# Patient Record
Sex: Male | Born: 1975 | Race: White | Hispanic: No | Marital: Married | Smoking: Never smoker
Health system: Southern US, Community
[De-identification: ages and names within clinical notes are randomized; demographics above are authoritative.]

## PROBLEM LIST (undated history)

## (undated) DIAGNOSIS — R579 Shock, unspecified: Secondary | ICD-10-CM

## (undated) DIAGNOSIS — E785 Hyperlipidemia, unspecified: Secondary | ICD-10-CM

## (undated) DIAGNOSIS — M109 Gout, unspecified: Secondary | ICD-10-CM

## (undated) DIAGNOSIS — I10 Essential (primary) hypertension: Secondary | ICD-10-CM

## (undated) DIAGNOSIS — K5792 Diverticulitis of intestine, part unspecified, without perforation or abscess without bleeding: Secondary | ICD-10-CM

## (undated) DIAGNOSIS — F32A Depression, unspecified: Secondary | ICD-10-CM

## (undated) DIAGNOSIS — F329 Major depressive disorder, single episode, unspecified: Secondary | ICD-10-CM

## (undated) DIAGNOSIS — F419 Anxiety disorder, unspecified: Secondary | ICD-10-CM

## (undated) HISTORY — DX: Essential (primary) hypertension: I10

---

## 2015-03-15 ENCOUNTER — Encounter (HOSPITAL_COMMUNITY): Payer: Self-pay | Admitting: Family Medicine

## 2015-03-15 ENCOUNTER — Emergency Department (HOSPITAL_COMMUNITY)
Admission: EM | Admit: 2015-03-15 | Discharge: 2015-03-15 | Discharge status: Against Medical Advice | Payer: Medicaid Other | Attending: Emergency Medicine | Admitting: Emergency Medicine

## 2015-03-15 ENCOUNTER — Emergency Department (HOSPITAL_COMMUNITY)
Admission: EM | Admit: 2015-03-15 | Discharge: 2015-03-15 | Disposition: A | Discharge status: Home / Self Care | Payer: Medicaid Other | Attending: Emergency Medicine | Admitting: Emergency Medicine

## 2015-03-15 ENCOUNTER — Emergency Department (HOSPITAL_COMMUNITY): Payer: Medicaid Other

## 2015-03-15 ENCOUNTER — Encounter (HOSPITAL_COMMUNITY): Payer: Self-pay | Admitting: *Deleted

## 2015-03-15 DIAGNOSIS — R52 Pain, unspecified: Secondary | ICD-10-CM

## 2015-03-15 DIAGNOSIS — M545 Low back pain: Secondary | ICD-10-CM | POA: Insufficient documentation

## 2015-03-15 DIAGNOSIS — R319 Hematuria, unspecified: Secondary | ICD-10-CM | POA: Insufficient documentation

## 2015-03-15 DIAGNOSIS — I10 Essential (primary) hypertension: Secondary | ICD-10-CM | POA: Diagnosis not present

## 2015-03-15 DIAGNOSIS — Z8659 Personal history of other mental and behavioral disorders: Secondary | ICD-10-CM | POA: Insufficient documentation

## 2015-03-15 DIAGNOSIS — Z8719 Personal history of other diseases of the digestive system: Secondary | ICD-10-CM | POA: Insufficient documentation

## 2015-03-15 DIAGNOSIS — R109 Unspecified abdominal pain: Secondary | ICD-10-CM | POA: Insufficient documentation

## 2015-03-15 HISTORY — DX: Essential (primary) hypertension: I10

## 2015-03-15 HISTORY — DX: Major depressive disorder, single episode, unspecified: F32.9

## 2015-03-15 HISTORY — DX: Depression, unspecified: F32.A

## 2015-03-15 HISTORY — DX: Anxiety disorder, unspecified: F41.9

## 2015-03-15 HISTORY — DX: Diverticulitis of intestine, part unspecified, without perforation or abscess without bleeding: K57.92

## 2015-03-15 LAB — URINALYSIS, ROUTINE W REFLEX MICROSCOPIC
BILIRUBIN URINE: NEGATIVE
GLUCOSE, UA: NEGATIVE mg/dL
GLUCOSE, UA: 100 mg/dL — AB
KETONES UR: NEGATIVE mg/dL
KETONES UR: 15 mg/dL — AB
Nitrite: NEGATIVE
Nitrite: POSITIVE — AB
Specific Gravity, Urine: 1.023 (ref 1.005–1.030)
Specific Gravity, Urine: 1.02 (ref 1.005–1.030)
pH: 6 (ref 5.0–8.0)
pH: 7 (ref 5.0–8.0)

## 2015-03-15 LAB — BASIC METABOLIC PANEL
ANION GAP: 8 (ref 5–15)
BUN: 14 mg/dL (ref 6–20)
CALCIUM: 9.5 mg/dL (ref 8.9–10.3)
CO2: 30 mmol/L (ref 22–32)
Chloride: 102 mmol/L (ref 101–111)
Creatinine, Ser: 0.9 mg/dL (ref 0.61–1.24)
GFR calc Af Amer: >60 mL/min (ref >60)
GLUCOSE: 132 mg/dL — AB (ref 65–99)
Potassium: 3.6 mmol/L (ref 3.5–5.1)
SODIUM: 140 mmol/L (ref 135–145)

## 2015-03-15 LAB — CBC WITH DIFFERENTIAL/PLATELET
Basophils Absolute: 0.1 10*3/uL (ref 0.0–0.1)
Basophils Relative: 1 %
Eosinophils Absolute: 0.4 10*3/uL (ref 0.0–0.7)
Eosinophils Relative: 4 %
HEMATOCRIT: 40.3 % (ref 39.0–52.0)
HEMOGLOBIN: 13.9 g/dL (ref 13.0–17.0)
LYMPHS ABS: 3.3 10*3/uL (ref 0.7–4.0)
Lymphocytes Relative: 32 %
MCH: 30.8 pg (ref 26.0–34.0)
MCHC: 34.5 g/dL (ref 30.0–36.0)
MCV: 89.2 fL (ref 78.0–100.0)
MONOS PCT: 7 %
Monocytes Absolute: 0.7 10*3/uL (ref 0.1–1.0)
NEUTROS ABS: 5.9 10*3/uL (ref 1.7–7.7)
NEUTROS PCT: 56 %
PLATELETS: 254 10*3/uL (ref 150–400)
RBC: 4.52 MIL/uL (ref 4.22–5.81)
RDW: 13.3 % (ref 11.5–15.5)
WBC: 10.4 10*3/uL (ref 4.0–10.5)

## 2015-03-15 LAB — URINE MICROSCOPIC-ADD ON: Squamous Epithelial / LPF: NONE SEEN

## 2015-03-15 MED ORDER — KETOROLAC TROMETHAMINE 30 MG/ML IJ SOLN
60.0000 mg | Freq: Once | INTRAMUSCULAR | Status: AC
  Administered 2015-03-15: 60 mg via INTRAMUSCULAR
  Filled 2015-03-15: qty 2

## 2015-03-15 MED ORDER — LIDOCAINE HCL (PF) 1 % IJ SOLN
INTRAMUSCULAR | Status: AC
  Administered 2015-03-15: 2.1 mL
  Filled 2015-03-15: qty 5

## 2015-03-15 MED ORDER — CEPHALEXIN 500 MG PO CAPS: 500.0000 mg | ORAL_CAPSULE | Freq: Four times a day (QID) | ORAL | Status: DC

## 2015-03-15 MED ORDER — CEFTRIAXONE SODIUM 1 G IJ SOLR
1.0000 g | Freq: Once | INTRAMUSCULAR | Status: AC
  Administered 2015-03-15: 1 g via INTRAMUSCULAR
  Filled 2015-03-15: qty 10

## 2015-03-15 NOTE — ED Notes (Signed)
Pt here for hematuria that started this am with blood clots. sts also lower back pain radiating into flank and leg.

## 2015-03-15 NOTE — ED Notes (Signed)
Patient father came to [facility] window. He stated that he Was going [facility]. I informed him that he was the next to go back Patient left.

## 2015-03-15 NOTE — Discharge Instructions (Signed)
Follow-up in one week with either [facility] or [facility] in [place]

## 2015-03-15 NOTE — ED Notes (Signed)
Pt c/o right flank pain that started three days ago, pain with urination and blood in urine this am,

## 2015-03-15 NOTE — ED Notes (Signed)
Pt states that he was at [facility] but left, did have urine sample performed while there today,

## 2015-03-15 NOTE — ED Provider Notes (Signed)
CSN: [id number]     Arrival date & time 03/15/15  1931 History   First MD Initiated Contact with Patient 03/15/15 2008     Chief Complaint  Patient presents with  . Hematuria     (Consider location/radiation/quality/duration/timing/severity/associated sxs/prior Treatment) Patient is a 39 y.o. male presenting with hematuria. The history is provided by the patient (Patient complains of right flank plain and blood in his urine.).  Hematuria This is a new problem. The current episode started yesterday. The problem occurs constantly. The problem has not changed since onset.Pertinent negatives include no chest pain, no abdominal pain and no headaches. Nothing aggravates the symptoms. Nothing relieves the symptoms.    Past Medical History  Diagnosis Date  . Depression   . Hypertension   . Anxiety   . Diverticulitis    History reviewed. No pertinent past surgical history. No family history on file. Social History  Substance Use Topics  . Smoking status: Never Smoker   . Smokeless tobacco: None  . Alcohol Use: No    Review of Systems  Constitutional: Negative for appetite change and fatigue.  HENT: Negative for congestion, ear discharge and sinus pressure.   Eyes: Negative for discharge.  Respiratory: Negative for cough.   Cardiovascular: Negative for chest pain.  Gastrointestinal: Negative for abdominal pain and diarrhea.  Genitourinary: Positive for hematuria. Negative for frequency.  Musculoskeletal: Negative for back pain.  Skin: Negative for rash.  Neurological: Negative for seizures and headaches.  Psychiatric/Behavioral: Negative for hallucinations.      Allergies  Shrimp and Sulfa antibiotics  Home Medications   Prior to Admission medications   Medication Sig Start Date End Date Taking? Authorizing Provider  cephALEXin (KEFLEX) 500 MG capsule Take 1 capsule (500 mg total) by mouth 4 (four) times daily. 03/15/15   Bethann BerkshireJoseph Kynisha Memon, MD   BP 150/85 mmHg  Pulse 96   Temp(Src) 98.3 F (36.8 C) (Oral)  Resp 16  Ht 5\' 10"  (1.778 m)  Wt 340 lb (154.223 kg)  BMI 48.78 kg/m2  SpO2 100% Physical Exam  Constitutional: He is oriented to person, place, and time. He appears well-developed.  HENT:  Head: Normocephalic.  Eyes: Conjunctivae and EOM are normal. No scleral icterus.  Neck: Neck supple. No thyromegaly present.  Cardiovascular: Normal rate and regular rhythm.  Exam reveals no gallop and no friction rub.   No murmur heard. Pulmonary/Chest: No stridor. He has no wheezes. He has no rales. He exhibits no tenderness.  Abdominal: He exhibits no distension. There is no tenderness. There is no rebound.  Musculoskeletal: Normal range of motion. He exhibits no edema.  Mild tenderness to right flank  Lymphadenopathy:    He has no cervical adenopathy.  Neurological: He is oriented to person, place, and time. He exhibits normal muscle tone. Coordination normal.  Skin: No rash noted. No erythema.  Psychiatric: He has a normal mood and affect. His behavior is normal.    ED Course  Procedures (including critical care time) Labs Review Labs Reviewed  BASIC METABOLIC PANEL - Abnormal; Notable for the following:    Glucose, Bld 132 (*)    All other components within normal limits  URINALYSIS, ROUTINE W REFLEX MICROSCOPIC (NOT AT [facility]) - Abnormal; Notable for the following:    Color, Urine RED (*)    APPearance CLOUDY (*)    Glucose, UA 100 (*)    Hgb urine dipstick LARGE (*)    Bilirubin Urine SMALL (*)    Ketones, ur 15 (*)  Protein, ur >300 (*)    Nitrite POSITIVE (*)    Leukocytes, UA MODERATE (*)    All other components within normal limits  URINE MICROSCOPIC-ADD ON - Abnormal; Notable for the following:    Squamous Epithelial / LPF 0-5 (*)    Bacteria, UA FEW (*)    All other components within normal limits  URINE CULTURE  CBC WITH DIFFERENTIAL/PLATELET    Imaging Review Ct Renal Stone Study  03/15/2015  CLINICAL DATA:  Hematuria since  this morning with low back pain radiating into right flank and right leg for 3 days, history of diverticulitis EXAM: CT ABDOMEN AND PELVIS WITHOUT CONTRAST TECHNIQUE: Multidetector CT imaging of the abdomen and pelvis was performed following the standard protocol without IV contrast. COMPARISON:  None. 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 Left kidney is normal. Right kidney is normal. No stones or hydronephrosis on either side. Bladder is normal. Stomach/Bowel: Appendix and large bowel are normal except for mild left colon diverticulosis. Trace increased attenuation along the anti mesenteric border at the junction of the descending and sigmoid colon. Small bowel and stomach are normal. Vascular/Lymphatic: Negative Reproductive: Negative Other: No ascites Musculoskeletal: No acute abnormalities IMPRESSION: No evidence of nephroureterolithiasis or hydroureteronephrosis. Diverticulosis, mild. Trace increased attenuation along the anti mesenteric border at the junction of the descending and sigmoid colon could reflect scarring from prior diverticulitis or minimal acute diverticulitis. Electronically Signed   By: Esperanza Heir M.D.   On: 03/15/2015 21:25   I have personally reviewed and evaluated these images and lab results as part of my medical decision-making.   EKG Interpretation None      MDM   Final diagnoses:  Pain  Hematuria    Renal CT scan negative treat patient for urinary tract infection.  He is given a prescription of Keflex and will follow-up with her primary care doctor or a light urology    Bethann Berkshire, MD 03/15/15 2151

## 2015-03-17 LAB — URINE CULTURE: Culture: NO GROWTH

## 2015-03-31 ENCOUNTER — Encounter (HOSPITAL_COMMUNITY): Payer: Self-pay | Admitting: *Deleted

## 2015-03-31 ENCOUNTER — Emergency Department (HOSPITAL_COMMUNITY)
Admission: EM | Admit: 2015-03-31 | Discharge: 2015-03-31 | Disposition: A | Discharge status: Home / Self Care | Payer: Medicaid Other | Attending: Emergency Medicine | Admitting: Emergency Medicine

## 2015-03-31 DIAGNOSIS — M545 Low back pain, unspecified: Secondary | ICD-10-CM

## 2015-03-31 DIAGNOSIS — Z792 Long term (current) use of antibiotics: Secondary | ICD-10-CM | POA: Insufficient documentation

## 2015-03-31 DIAGNOSIS — Z8719 Personal history of other diseases of the digestive system: Secondary | ICD-10-CM | POA: Diagnosis not present

## 2015-03-31 DIAGNOSIS — I10 Essential (primary) hypertension: Secondary | ICD-10-CM | POA: Insufficient documentation

## 2015-03-31 DIAGNOSIS — Z8659 Personal history of other mental and behavioral disorders: Secondary | ICD-10-CM | POA: Diagnosis not present

## 2015-03-31 DIAGNOSIS — R319 Hematuria, unspecified: Secondary | ICD-10-CM | POA: Diagnosis not present

## 2015-03-31 LAB — URINALYSIS, ROUTINE W REFLEX MICROSCOPIC
GLUCOSE, UA: NEGATIVE mg/dL
KETONES UR: NEGATIVE mg/dL
Leukocytes, UA: NEGATIVE
NITRITE: NEGATIVE
PH: 5.5 (ref 5.0–8.0)
PROTEIN: 30 mg/dL — AB
Specific Gravity, Urine: 1.015 (ref 1.005–1.030)

## 2015-03-31 LAB — URINE MICROSCOPIC-ADD ON

## 2015-03-31 MED ORDER — OXYCODONE-ACETAMINOPHEN 5-325 MG PO TABS
2.0000 | ORAL_TABLET | Freq: Once | ORAL | Status: AC
  Administered 2015-03-31: 2 via ORAL
  Filled 2015-03-31: qty 2

## 2015-03-31 MED ORDER — OXYCODONE-ACETAMINOPHEN 5-325 MG PO TABS: 1.0000 | ORAL_TABLET | ORAL | Status: AC | PRN

## 2015-03-31 MED ORDER — CYCLOBENZAPRINE HCL 10 MG PO TABS: 10.0000 mg | ORAL_TABLET | Freq: Three times a day (TID) | ORAL | Status: AC | PRN

## 2015-03-31 MED ORDER — ONDANSETRON 8 MG PO TBDP
8.0000 mg | ORAL_TABLET | Freq: Once | ORAL | Status: AC
  Administered 2015-03-31: 8 mg via ORAL
  Filled 2015-03-31: qty 1

## 2015-03-31 NOTE — Discharge Instructions (Signed)

## 2015-03-31 NOTE — ED Notes (Signed)
Pt has had lower back pain for several weeks. States his pain shoots down his legs. NAD noted.

## 2015-04-02 LAB — URINE CULTURE: Culture: 1000

## 2015-04-03 NOTE — ED Provider Notes (Signed)
CSN: [id number]     Arrival date & time 03/31/15  1831 History   First MD Initiated Contact with Patient 03/31/15 1841     Chief Complaint  Patient presents with  . Back Pain     (Consider location/radiation/quality/duration/timing/severity/associated sxs/prior Treatment) HPI   Charles Cohen is a 40 y.o. male who presents to the Emergency Department complaining of low back pain for several weeks.  He describes the pain as sharp and shooting, radiating into both legs.  He states the pain is constant and worsens with movement.  He states he was seen here 2 weeks ago for hematuria which he states improved after taking antibiotics.  He denies injury, abd pain, numbness or weakness of the extremities, urine or bowel changes.  He has not taken any medications for his back pain symptoms.   Past Medical History  Diagnosis Date  . Depression   . Hypertension   . Anxiety   . Diverticulitis    History reviewed. No pertinent past surgical history. No family history on file. Social History  Substance Use Topics  . Smoking status: Never Smoker   . Smokeless tobacco: None  . Alcohol Use: No    Review of Systems  Constitutional: Negative for fever.  Respiratory: Negative for shortness of breath.   Gastrointestinal: Negative for vomiting, abdominal pain and constipation.  Genitourinary: Negative for dysuria, hematuria, flank pain, decreased urine volume and difficulty urinating.  Musculoskeletal: Positive for back pain. Negative for joint swelling.  Skin: Negative for rash.  Neurological: Negative for weakness and numbness.  All other systems reviewed and are negative.     Allergies  Shrimp and Sulfa antibiotics  Home Medications   Prior to Admission medications   Medication Sig Start Date End Date Taking? Authorizing Provider  cephALEXin (KEFLEX) 500 MG capsule Take 1 capsule (500 mg total) by mouth 4 (four) times daily. 03/15/15   Bethann Berkshire, MD  cyclobenzaprine (FLEXERIL) 10  MG tablet Take 1 tablet (10 mg total) by mouth 3 (three) times daily as needed. 03/31/15   Analyn Matusek, PA-C  oxyCODONE-acetaminophen (PERCOCET/ROXICET) 5-325 MG tablet Take 1 tablet by mouth every 4 (four) hours as needed. 03/31/15   Delorus Langwell, PA-C   BP 153/72 mmHg  Pulse 113  Temp(Src) 100.2 F (37.9 C) (Oral)  Resp 20  Ht 5\' 10"  (1.778 m)  Wt 154.223 kg  BMI 48.78 kg/m2  SpO2 100% Physical Exam  Constitutional: He is oriented to person, place, and time. He appears well-developed and well-nourished. No distress.  HENT:  Head: Normocephalic and atraumatic.  Neck: Normal range of motion. Neck supple.  Cardiovascular: Normal rate, regular rhythm, normal heart sounds and intact distal pulses.   No murmur heard. Pulmonary/Chest: Effort normal and breath sounds normal. No respiratory distress.  Abdominal: Soft. He exhibits no distension. There is no tenderness.  Musculoskeletal: He exhibits tenderness. He exhibits no edema.       Lumbar back: He exhibits tenderness and pain. He exhibits normal range of motion, no swelling, no deformity, no laceration and normal pulse.  ttp of the bilateral lumbar paraspinal muscles.  DP pulses are brisk and symmetrical.  Distal sensation intact.  Hip Flexors/Extensors are intact.  Pt has 5/5 strength against resistance of bilateral lower extremities.     Neurological: He is alert and oriented to person, place, and time. He has normal strength. No sensory deficit. He exhibits normal muscle tone. Coordination and gait normal.  Reflex Scores:      Patellar reflexes are  2+ on the right side and 2+ on the left side.      Achilles reflexes are 2+ on the right side and 2+ on the left side. Skin: Skin is warm and dry. No rash noted.  Nursing note and vitals reviewed.   ED Course  Procedures (including critical care time) Labs Review Labs Reviewed  URINALYSIS, ROUTINE W REFLEX MICROSCOPIC (NOT AT [facility]) - Abnormal; Notable for the following:    Hgb urine  dipstick TRACE (*)    Bilirubin Urine MODERATE (*)    Protein, ur 30 (*)    All other components within normal limits  URINE MICROSCOPIC-ADD ON - Abnormal; Notable for the following:    Squamous Epithelial / LPF 0-5 (*)    Bacteria, UA RARE (*)    All other components within normal limits  URINE CULTURE    Imaging Review No results found. I have personally reviewed and evaluated these images and lab results as part of my medical decision-making.   EKG Interpretation None      MDM   Final diagnoses:  Bilateral low back pain without sciatica  Hematuria   Urine culture pending.  Pt continues to have hematuria despite recent antibiotics. He is well appearing, ambulates with a steady gait.  No concerning sx's for emergent neurological process.  I have discussed care plan with Dr. Estell HarpinZammit and pt advised to arrange urology f/u .  Pt agrees to plan and appears steady for d/c      Pauline Ausammy Juneau Doughman, PA-C 04/03/15 2124  Bethann BerkshireJoseph Zammit, MD 04/05/15 [phone]

## 2015-04-07 ENCOUNTER — Ambulatory Visit (INDEPENDENT_AMBULATORY_CARE_PROVIDER_SITE_OTHER): Payer: Medicaid Other | Admitting: Urology

## 2015-04-07 ENCOUNTER — Ambulatory Visit: Payer: Self-pay | Admitting: Urology

## 2015-04-07 DIAGNOSIS — R3129 Other microscopic hematuria: Secondary | ICD-10-CM

## 2015-04-07 DIAGNOSIS — N390 Urinary tract infection, site not specified: Secondary | ICD-10-CM

## 2015-04-07 DIAGNOSIS — R822 Biliuria: Secondary | ICD-10-CM

## 2015-04-07 DIAGNOSIS — A499 Bacterial infection, unspecified: Secondary | ICD-10-CM

## 2015-04-09 ENCOUNTER — Encounter (INDEPENDENT_AMBULATORY_CARE_PROVIDER_SITE_OTHER): Payer: Self-pay | Admitting: *Deleted

## 2015-04-12 ENCOUNTER — Emergency Department (HOSPITAL_COMMUNITY): Payer: Medicaid Other

## 2015-04-12 ENCOUNTER — Ambulatory Visit (INDEPENDENT_AMBULATORY_CARE_PROVIDER_SITE_OTHER): Payer: Medicaid Other | Admitting: Internal Medicine

## 2015-04-12 ENCOUNTER — Emergency Department (HOSPITAL_COMMUNITY)
Admission: EM | Admit: 2015-04-12 | Discharge: 2015-04-12 | Disposition: A | Discharge status: Home / Self Care | Payer: Medicaid Other | Attending: Emergency Medicine | Admitting: Emergency Medicine

## 2015-04-12 ENCOUNTER — Encounter (INDEPENDENT_AMBULATORY_CARE_PROVIDER_SITE_OTHER): Payer: Self-pay | Admitting: Internal Medicine

## 2015-04-12 ENCOUNTER — Encounter (HOSPITAL_COMMUNITY): Payer: Self-pay | Admitting: *Deleted

## 2015-04-12 VITALS — BP 128/78 | HR 112 | Temp 97.9°F | Ht 70.0 in | Wt 320.3 lb

## 2015-04-12 DIAGNOSIS — F329 Major depressive disorder, single episode, unspecified: Secondary | ICD-10-CM | POA: Insufficient documentation

## 2015-04-12 DIAGNOSIS — R822 Biliuria: Secondary | ICD-10-CM | POA: Diagnosis not present

## 2015-04-12 DIAGNOSIS — I10 Essential (primary) hypertension: Secondary | ICD-10-CM | POA: Diagnosis not present

## 2015-04-12 DIAGNOSIS — R0602 Shortness of breath: Secondary | ICD-10-CM | POA: Diagnosis not present

## 2015-04-12 DIAGNOSIS — R109 Unspecified abdominal pain: Secondary | ICD-10-CM

## 2015-04-12 DIAGNOSIS — F419 Anxiety disorder, unspecified: Secondary | ICD-10-CM | POA: Diagnosis not present

## 2015-04-12 DIAGNOSIS — Z8719 Personal history of other diseases of the digestive system: Secondary | ICD-10-CM | POA: Insufficient documentation

## 2015-04-12 DIAGNOSIS — R319 Hematuria, unspecified: Secondary | ICD-10-CM | POA: Insufficient documentation

## 2015-04-12 DIAGNOSIS — R2243 Localized swelling, mass and lump, lower limb, bilateral: Secondary | ICD-10-CM | POA: Diagnosis present

## 2015-04-12 DIAGNOSIS — M109 Gout, unspecified: Secondary | ICD-10-CM

## 2015-04-12 DIAGNOSIS — Z79899 Other long term (current) drug therapy: Secondary | ICD-10-CM | POA: Diagnosis not present

## 2015-04-12 DIAGNOSIS — M25579 Pain in unspecified ankle and joints of unspecified foot: Secondary | ICD-10-CM

## 2015-04-12 DIAGNOSIS — R000 Tachycardia, unspecified: Secondary | ICD-10-CM | POA: Diagnosis not present

## 2015-04-12 DIAGNOSIS — R809 Proteinuria, unspecified: Secondary | ICD-10-CM

## 2015-04-12 HISTORY — DX: Gout, unspecified: M10.9

## 2015-04-12 LAB — CBC WITH DIFFERENTIAL/PLATELET
BASOS PCT: 0 %
Basophils Absolute: 0.1 10*3/uL (ref 0.0–0.1)
EOS ABS: 0.1 10*3/uL (ref 0.0–0.7)
EOS PCT: 0 %
HCT: 38.8 % — ABNORMAL LOW (ref 39.0–52.0)
Hemoglobin: 12.9 g/dL — ABNORMAL LOW (ref 13.0–17.0)
Lymphocytes Relative: 10 %
Lymphs Abs: 2.1 10*3/uL (ref 0.7–4.0)
MCH: 29.3 pg (ref 26.0–34.0)
MCHC: 33.2 g/dL (ref 30.0–36.0)
MCV: 88 fL (ref 78.0–100.0)
MONO ABS: 1.5 10*3/uL — AB (ref 0.1–1.0)
MONOS PCT: 7 %
Neutro Abs: 17.1 10*3/uL — ABNORMAL HIGH (ref 1.7–7.7)
Neutrophils Relative %: 83 %
Platelets: 687 10*3/uL — ABNORMAL HIGH (ref 150–400)
RBC: 4.41 MIL/uL (ref 4.22–5.81)
RDW: 13.6 % (ref 11.5–15.5)
WBC: 20.7 10*3/uL — ABNORMAL HIGH (ref 4.0–10.5)

## 2015-04-12 LAB — URINALYSIS, ROUTINE W REFLEX MICROSCOPIC
Glucose, UA: NEGATIVE mg/dL
NITRITE: NEGATIVE
PH: 5 (ref 5.0–8.0)
Protein, ur: 100 mg/dL — AB
Specific Gravity, Urine: 1.025 (ref 1.005–1.030)

## 2015-04-12 LAB — COMPREHENSIVE METABOLIC PANEL
ALBUMIN: 3 g/dL — AB (ref 3.5–5.0)
ALT: 80 U/L — ABNORMAL HIGH (ref 17–63)
ANION GAP: 13 (ref 5–15)
AST: 45 U/L — ABNORMAL HIGH (ref 15–41)
Alkaline Phosphatase: 151 U/L — ABNORMAL HIGH (ref 38–126)
BILIRUBIN TOTAL: 1.7 mg/dL — AB (ref 0.3–1.2)
BUN: 21 mg/dL — ABNORMAL HIGH (ref 6–20)
CO2: 25 mmol/L (ref 22–32)
Calcium: 9.5 mg/dL (ref 8.9–10.3)
Chloride: 94 mmol/L — ABNORMAL LOW (ref 101–111)
Creatinine, Ser: 1.34 mg/dL — ABNORMAL HIGH (ref 0.61–1.24)
GFR calc Af Amer: >60 mL/min (ref >60)
GFR calc non Af Amer: >60 mL/min (ref >60)
GLUCOSE: 178 mg/dL — AB (ref 65–99)
POTASSIUM: 4.2 mmol/L (ref 3.5–5.1)
SODIUM: 132 mmol/L — AB (ref 135–145)
TOTAL PROTEIN: 8.1 g/dL (ref 6.5–8.1)

## 2015-04-12 LAB — URINE MICROSCOPIC-ADD ON

## 2015-04-12 LAB — RAPID HIV SCREEN (HIV 1/2 AB+AG)
HIV 1/2 ANTIBODIES: NONREACTIVE
HIV-1 P24 Antigen - HIV24: NONREACTIVE

## 2015-04-12 LAB — HEPATIC FUNCTION PANEL
ALK PHOS: 156 U/L — AB (ref 40–115)
ALT: 81 U/L — AB (ref 9–46)
AST: 40 U/L (ref 10–40)
Albumin: 3.5 g/dL — ABNORMAL LOW (ref 3.6–5.1)
BILIRUBIN INDIRECT: 0.8 mg/dL (ref 0.2–1.2)
Bilirubin, Direct: 0.7 mg/dL — ABNORMAL HIGH (ref <=0.2)
TOTAL PROTEIN: 7.2 g/dL (ref 6.1–8.1)
Total Bilirubin: 1.5 mg/dL — ABNORMAL HIGH (ref 0.2–1.2)

## 2015-04-12 LAB — CK: Total CK: 109 U/L (ref 49–397)

## 2015-04-12 LAB — BRAIN NATRIURETIC PEPTIDE: B Natriuretic Peptide: 26 pg/mL (ref 0.0–100.0)

## 2015-04-12 LAB — URIC ACID: Uric Acid, Serum: 8.3 mg/dL — ABNORMAL HIGH (ref 4.4–7.6)

## 2015-04-12 MED ORDER — PREDNISONE 10 MG PO TABS: ORAL_TABLET | ORAL | Status: AC

## 2015-04-12 MED ORDER — IOHEXOL 300 MG/ML  SOLN
100.0000 mL | Freq: Once | INTRAMUSCULAR | Status: AC | PRN
  Administered 2015-04-12: 100 mL via INTRAVENOUS

## 2015-04-12 MED ORDER — SODIUM CHLORIDE 0.9 % IV BOLUS (SEPSIS)
1000.0000 mL | Freq: Once | INTRAVENOUS | Status: AC
  Administered 2015-04-12: 1000 mL via INTRAVENOUS

## 2015-04-12 MED ORDER — INDOMETHACIN 50 MG PO CAPS: 50.0000 mg | ORAL_CAPSULE | Freq: Three times a day (TID) | ORAL | Status: AC

## 2015-04-12 MED ORDER — COLCHICINE 0.6 MG PO TABS
1.2000 mg | ORAL_TABLET | Freq: Once | ORAL | Status: AC
  Administered 2015-04-12: 1.2 mg via ORAL
  Filled 2015-04-12: qty 2

## 2015-04-12 NOTE — ED Notes (Signed)
Pt comes in with bilateral ankle swelling and pain. Pt states this started x1 week ago. Pt denies any heart problems in the past.

## 2015-04-12 NOTE — ED Provider Notes (Signed)
The pt is a 40 y/o male who is [place] He presents with c/o ankle pain and swelling - states that he was told in the past that he had gout X 1 time. He has had complicated couple of months with regards to his healthcare - he has had hematuria that started back in November and was seen in the ED as well as referred recently to Urology - he did not improve with antibiotics and was told by Urology that he needed to see the GI specialist b/c of bilirubin in his Urine - he saw them today and had labs drawn - no other w/u at this point.  The pt has had symptoms of late including loose and watery brown stools which are frequent, ongoing hematuria, he is now developing swelling of his bilateral ankles and feet with an associated severely stiff right shoulder. He does not know the etiology of all these symptoms. He reports that he has had very little appetite and has had one sandwich in 3 days, no other oral intake other than some fluids. He states when he tries to eat he immediately has nausea and no appetite. Overall he denies any abdominal pain and does not feel as though he is gaining weight, in fact he was surprised to see that his weight was down by 20 pounds from what he expected to 320.  The patient had recently been on Truvada, the reason for this is not completely clear though he states that he had discussed this with his family doctor prior to moving to this area, it seems as though he was using it in a preventative way though he denies having a partner which was HIV positive and he states that he himself is HIV negative.    on exam the patient is mildly tachycardic, his lungs are clear, he does not appear to be in distress. His abdomen is morbidly obese, nontender, it is grossly distended with tympanic sounds to percussion diffusely. He has no guarding or peritoneal signs, no obvious masses. His pulses are strong, there is no JVD, his mucous membranes are dry, he has full range motion of the neck without  any stiffness, his mental status is totally normal, he follows commands without any difficulties. His muscular skeletal exam is concerning in that he has bilateral ankle and foot pain, swelling, warmth as well as his right shoulder which she is resistant to have any abduction. He has normal elbows, wrists, knees and hips. His fingers have no inflammation or swelling.  Initial labs show a leukocytosis, a mild acute kidney injury which is likely related to dehydration however with the underlying hematuria which he is having it raises suspicion that there is another process ongoing. Will hydrate, evaluate with multiple different labs, CT of the abdomen and pelvis to further evaluate his intra-abdominal cavity, also will add sedimentation rate, CRP, repeat urinalysis, creatine kinase,  Medical screening examination/treatment/procedure(s) were conducted as a shared visit with non-physician practitioner(s) and myself.  I personally evaluated the patient during the encounter.  Clinical Impression:   Final diagnoses:  Ankle pain  Hematuria  Proteinuria  Acute gout, unspecified cause, unspecified site           Eber Hong, MD 04/17/15 0006

## 2015-04-12 NOTE — Discharge Instructions (Signed)
Please read and follow all provided instructions.  Your diagnoses today include:  1. Acute gout, unspecified cause, unspecified site   2. Ankle pain   3. Abdominal pain   4. Hematuria   5. Proteinuria    Tests performed today include:  Vital signs. See below for your results today.   Medications prescribed:   Take Prednisone for 6 days. Follow instructions on prescription    Indomethacin for 3 days. Follow instructions on prescription   Home care instructions:  Follow any educational materials contained in this packet.  Follow-up instructions: Please follow-up with one of the primary care provider in the provided list within the week to go over the lab results done here in the Emergency department today.   Please follow-up with a Rheumatologist through your Primacy Care Provider       Return instructions:   Please return to the Emergency Department if you do not get better, if you get worse, or new symptoms OR  - Fever (temperature greater than 101.64F)  - Bleeding that does not stop with holding pressure to the area    -Severe pain (please note that you may be more sore the day after your accident)  - Chest Pain  - Difficulty breathing  - Severe nausea or vomiting  - Inability to tolerate food and liquids  - Passing out  - Skin becoming red around your wounds  - Change in mental status (confusion or lethargy)  - New numbness or weakness     Please return if you have any other emergent concerns.  Additional Information:  Your vital signs today were: BP 142/74 mmHg   Pulse 103   Temp(Src) 99.5 F (37.5 C) (Oral)   Resp 18   Ht 5\' 10"  (1.778 m)   Wt 145.151 kg   BMI 45.92 kg/m2   SpO2 100% If your blood pressure (BP) was elevated above 135/85 this visit, please have this repeated by your doctor within one month. ---------------  [facility] [place] [facility] Doctor List  Kari BaarsEdward Hawkins MD. Specialty: Pulmonary Disease Contact information: [address]  [address]    [place] [address]  [phone]   Syliva OvermanMargaret Simpson, MD. Specialty: [facility] Contact information: [address]  [place] [address]  [phone]   Lilyan PuntScott Luking, MD. Specialty: Family Medicine Contact information: [address]  [address]  [place] [address]  [phone]   Avon Gullyesfaye Fanta, MD Specialty: Internal Medicine Contact information: [address]  [place] [address]  [phone]   Catalina PizzaZach Hall, MD. Specialty: Internal Medicine Contact information: [address]  [place] [address]  [phone]   John GiovanniStephen Knowlton, MD. Specialty: Family Medicine Contact information: [address]  [address]  [place] [address]  [phone]   Carylon Perchesoy Fagan, MD. Specialty: Internal Medicine Contact information: [address]  [address]  [place] [address]  [phone]

## 2015-04-12 NOTE — ED Provider Notes (Signed)
CSN: [id number]     Arrival date & time 04/12/15  1236 History   First MD Initiated Contact with Patient 04/12/15 1458     Chief Complaint  Patient presents with  . Leg Swelling  . Shortness of Breath   (Consider location/radiation/quality/duration/timing/severity/associated sxs/prior Treatment) HPI 40 y.o. male with a hx of gout, presents to the Emergency Department today complaining of bilateral ankle pain x2 weeks. Notes swelling and tenderness along ankle joint as well as posterior calf. 10/10 pain. Able to ambulate with discomfort. Notes that it feels similar to a gout episode he has had in the past. He has shortness of breath, dizzy, nausea, no trauma.     Past Medical History  Diagnosis Date  . Depression   . Hypertension   . Anxiety   . Diverticulitis   . High blood pressure   . Gout    History reviewed. No pertinent past surgical history. No family history on file. Social History  Substance Use Topics  . Smoking status: Never Smoker   . Smokeless tobacco: None  . Alcohol Use: No    Review of Systems 10 Systems reviewed and all are negative for acute change except as noted in the HPI.  Allergies  Shrimp and Sulfa antibiotics  Home Medications   Prior to Admission medications   Medication Sig Start Date End Date Taking? Authorizing Provider  cyclobenzaprine (FLEXERIL) 10 MG tablet Take 1 tablet (10 mg total) by mouth 3 (three) times daily as needed. Patient taking differently: Take 10 mg by mouth as needed.  03/31/15  Yes Tammy Triplett, PA-C  emtricitabine-tenofovir (TRUVADA) 200-300 MG tablet Take 1 tablet by mouth daily.   Yes Historical Provider, MD  hydrochlorothiazide (HYDRODIURIL) 25 MG tablet Take 25 mg by mouth daily.   Yes Historical Provider, MD  lisinopril (PRINIVIL,ZESTRIL) 30 MG tablet Take 30 mg by mouth daily.   Yes Historical Provider, MD  loratadine (CLARITIN) 10 MG tablet Take 10 mg by mouth daily.   Yes Historical Provider, MD  Misc Natural  Products (BLACK CHERRY CONCENTRATE PO) Take by mouth.   Yes Historical Provider, MD  Misc Natural Products (BLACK CHERRY CONCENTRATE) LIQD Take by mouth daily.   Yes Historical Provider, MD  oxyCODONE-acetaminophen (PERCOCET/ROXICET) 5-325 MG tablet Take 1 tablet by mouth every 4 (four) hours as needed. 03/31/15  Yes Tammy Triplett, PA-C  PARoxetine (PAXIL) 20 MG tablet Take 20 mg by mouth daily.   Yes Historical Provider, MD   BP 146/92 mmHg  Pulse 114  Temp(Src) 99.6 F (37.6 C) (Oral)  Resp 18  Ht 5\' 10"  (1.778 m)  Wt 145.151 kg  BMI 45.92 kg/m2  SpO2 98%   Physical Exam  Constitutional: He is oriented to person, place, and time. He appears well-developed and well-nourished.  HENT:  Head: Normocephalic and atraumatic.  Eyes: EOM are normal.  Cardiovascular: Normal rate, regular rhythm, S1 normal, S2 normal, normal heart sounds and intact distal pulses.   Pulmonary/Chest: Effort normal and breath sounds normal.  Abdominal: Soft. Normal appearance and bowel sounds are normal. There is no tenderness. There is no CVA tenderness, no tenderness at McBurney's point and negative Murphy's sign.  Musculoskeletal:       Right shoulder: He exhibits decreased range of motion.       Right ankle: He exhibits decreased range of motion and swelling. Tenderness. Medial malleolus tenderness found.       Left ankle: He exhibits decreased range of motion and swelling. Tenderness. Medial malleolus tenderness found.  Posterior calf tenderness on palpation bilaterally  Neurological: He is alert and oriented to person, place, and time.  Skin: Skin is warm and dry.  Psychiatric: He has a normal mood and affect. His behavior is normal. Thought content normal.  Nursing note and vitals reviewed.  ED Course  Procedures (including critical care time) Labs Review Labs Reviewed  CBC WITH DIFFERENTIAL/PLATELET - Abnormal; Notable for the following:    WBC 20.7 (*)    Hemoglobin 12.9 (*)    HCT 38.8 (*)     Platelets 687 (*)    Neutro Abs 17.1 (*)    Monocytes Absolute 1.5 (*)    All other components within normal limits  COMPREHENSIVE METABOLIC PANEL - Abnormal; Notable for the following:    Sodium 132 (*)    Chloride 94 (*)    Glucose, Bld 178 (*)    BUN 21 (*)    Creatinine, Ser 1.34 (*)    Albumin 3.0 (*)    AST 45 (*)    ALT 80 (*)    Alkaline Phosphatase 151 (*)    Total Bilirubin 1.7 (*)    All other components within normal limits  URINALYSIS, ROUTINE W REFLEX MICROSCOPIC (NOT AT [facility]) - Abnormal; Notable for the following:    Color, Urine AMBER (*)    Hgb urine dipstick MODERATE (*)    Bilirubin Urine MODERATE (*)    Ketones, ur TRACE (*)    Protein, ur 100 (*)    Leukocytes, UA TRACE (*)    All other components within normal limits  URIC ACID - Abnormal; Notable for the following:    Uric Acid, Serum 8.3 (*)    All other components within normal limits  URINE MICROSCOPIC-ADD ON - Abnormal; Notable for the following:    Squamous Epithelial / LPF 0-5 (*)    Bacteria, UA FEW (*)    Casts GRANULAR CAST (*)    All other components within normal limits  URINE CULTURE  BRAIN NATRIURETIC PEPTIDE  CK  RAPID HIV SCREEN (HIV 1/2 AB+AG)  ANTI-SMOOTH MUSCLE ANTIBODY, IGG  ANTINUCLEAR ANTIBODIES, IFA  B. BURGDORFI ANTIBODIES    Imaging Review Dg Chest 2 View  04/12/2015  CLINICAL DATA:  40 year old male with bilateral ankle swelling and pain due to severe gout EXAM: CHEST  2 VIEW COMPARISON:  Prior CT scan of the abdomen and pelvis including lung bases 03/15/2015 FINDINGS: Linear atelectasis in the anterior aspect of the left lower lobe. 5 mm nodular opacity projects over the right base on the frontal view. Correlation with prior CT scan of the abdomen and pelvis demonstrates a partially calcified granuloma in the right lower lobe corresponding with this opacity. Cardiac and mediastinal contours are within normal limits. Minimal bronchitic change. No pleural effusion, pulmonary  edema or pneumothorax. No acute osseous abnormality. Mild degenerative change noted at the right acromioclavicular joint. IMPRESSION: 1. Mild chronic elevation of the left hemidiaphragm with associated linear atelectasis. 2. Otherwise, no acute cardiopulmonary process. 3. Granuloma in the right lower lobe as seen on prior CT imaging. Electronically Signed   By: Malachy MoanHeath  McCullough M.D.   On: 04/12/2015 13:06   Dg Shoulder Right  04/12/2015  CLINICAL DATA:  Right shoulder pain 1 week with no injury EXAM: RIGHT SHOULDER - 2+ VIEW COMPARISON:  None. FINDINGS: Limited study with AP and Y-view demonstrating no evidence of fracture or dislocation. No significant glenohumeral abnormality. Mild erosive degenerative change of the acromioclavicular joint. IMPRESSION: No acute findings Electronically Signed  By: Esperanza Heir M.D.   On: 04/12/2015 17:00   Dg Ankle Complete Left  04/12/2015  CLINICAL DATA:  Right and left ankle swelling and pain for 2 weeks. No known injury. EXAM: LEFT ANKLE COMPLETE - 3+ VIEW COMPARISON:  None. FINDINGS: Diffuse soft tissue swelling. No acute bony abnormality. Specifically, no fracture, subluxation, or dislocation. Soft tissues are intact. Plantar calcaneal spur. IMPRESSION: No acute bony abnormality. Electronically Signed   By: Charlett Nose M.D.   On: 04/12/2015 16:53   Dg Ankle Complete Right  04/12/2015  CLINICAL DATA:  Right and left ankle pain and swelling for 2 weeks, no known injury EXAM: RIGHT ANKLE - COMPLETE 3+ VIEW COMPARISON:  None. FINDINGS: Three views of the right ankle submitted. No acute fracture or subluxation. Ankle mortise is preserved. Diffuse soft tissue swelling is noted. Small plantar spur of calcaneus. IMPRESSION: No acute fracture or subluxation. Soft tissue swelling around the ankle. Small plantar spur of calcaneus Electronically Signed   By: Natasha Mead M.D.   On: 04/12/2015 16:52   Ct Abdomen Pelvis W Contrast  04/12/2015  CLINICAL DATA:  Generalized  abdominal pain and swelling for 2 weeks. Redness and swelling bilateral feet for 2 weeks. EXAM: CT ABDOMEN AND PELVIS WITH CONTRAST TECHNIQUE: Multidetector CT imaging of the abdomen and pelvis was performed using the standard protocol following bolus administration of intravenous contrast. CONTRAST:  OMNIPAQUE IOHEXOL 300 MG/ML  SOLN COMPARISON:  March 15, 2015 FINDINGS: A nodule in the right lung base on series 5, image 5 is stable. Lingular atelectasis is noted, subsegmental. Lung bases are otherwise within normal limits. Specifically, the heart size normal. No free air or free fluid. The stomach and small bowel are normal in appearance and caliber. The colon contains fluid, suggesting a history of diarrhea. The colon is thin walled however with no pericolonic stranding to suggest acute colitis. An air-fluid level is seen in the cecum, with another in the ascending colon. The appendix is well seen with no appendicitis. There is poor contrast enhancement on today's study, possibly due to the fact the patient vomited during the study or due to timing of contrast bolus. However, the lack of contrast limits evaluation of parenchymal organs. No focal hepatic lesions are identified. The gallbladder is normal in appearance. The spleen, pancreas, adrenal glands, and right kidney are normal. There is a probable cyst off the upper pole of the left kidney, too small to characterize. There is contrast in the renal collecting systems on delayed imaging with no filling defects. The abdominal aorta is normal in caliber. No adenopathy. Limited views of portal vein are normal. No significant ventral wall defects. The pelvis demonstrates no adenopathy or mass. The bladder is poorly distended but unremarkable. The prostate seminal vesicles are normal. Degenerative change are seen in the lower lumbar facets. Bones are otherwise unremarkable. IMPRESSION: 1. Colonic dilatation, likely due to ileus. No colonic wall thickening  or pericolonic stranding to suggest colitis. Fluid in the colon consistent with diarrhea. There are few scattered colonic diverticuli with no diverticulitis. Electronically Signed   By: Gerome Sam III M.D   On: 04/12/2015 17:45   I have personally reviewed and evaluated these images and lab results as part of my medical decision-making.   EKG Interpretation None      MDM  I have reviewed relevant laboratory values. I have reviewed relevant imaging studies. I have reviewed the relevant previous healthcare records. I obtained HPI from historian. Patient discussed with supervising  physician  ED Course: CT Abdomen/Pelvis W/ Contrast XR Bilateral Ankle, R Shoulder CBC, CMP, BNP, CK, UA, HIV, Uric Acid, Igg, Lyme, ANA  Assessment: 39y M presents to the ED with biltateral ankle tenderness and ankle swelling for the past 2 weeks. Seen by Urologist earlier today after failed outpatient treatment of Hematuria after ABX treatment with Keflex. Hematuria has been ongoing since November. Has hx of Gout. Given 1.2mg  Colchicine in ED. Also currently having loose stools, and decreased appetite with nausea with food intake. Reports that he was taking Truvada as HIV prophylaxis from previous provider. States that he started taking the medication because he had a previous partner with HIV and was worried, even though he states he had a neg HIV screen. On exam, he is tachycardic, but otherwise in NAD. Afebrile. Patient is nontoxic, nonseptic appearing. Patient does not have a surgical abdomen and there are no peritoneal signs. Lungs are CTA. Labs show leukocytosis, mild acute kidney injury, and bilirubinemia, UA shows hemoglobinuria/bilirubinuria. CT ABD/Pelvis unremarkable. Given 2L NS while in ED. Will discharge with Indomethacin and Prednisone taper with strict follow up with PCP for further evaluation, lab review, and treatment within the week. Will have PCP refer to rheumatology.  Patient is in no acute  distress. Vital Signs are stable. Patient is able to ambulate. Patient able to tolerate PO. Patient refused to stay for fluids to complete and requested discharge.   Disposition/Plan:  DC Home Additional Verbal discharge instructions given and discussed with patient.  Pt Instructed to f/u with PCP in the next 48 hours for evaluation and treatment of symptoms. Return precautions given Pt acknowledges and agrees with plan  Supervising Physician Eber Hong, MD   Final diagnoses:  Ankle pain  Hematuria  Proteinuria  Acute gout, unspecified cause, unspecified site      Audry Pili, PA-C 04/12/15 2005  Eber Hong, MD 04/17/15 [phone]

## 2015-04-12 NOTE — Progress Notes (Addendum)
Subjective:    Patient ID: Charles BeamsRobert Cohen, male    DOB: 1975/10/22, 40 y.o.   MRN: [id number]   HPI Referred by [facility] Wilkie Aye(Patrick McKenzie) for bilirubin in his urine. Recently noted 04/08/2014. His significant other is present in room.  He tells me his urine is orange in color or tangerine in color. He drinks as much fluids as he can. He voids about about 4 times a day. HCTZ is on hold. Appetite is not good. He has thinks he may have lost about 20 pounds per weight at [facility], he was 340. This sounds like an error to me (04/06/2014) He is having a gout flare today both ankles. He is having difficulty walking due to the pain.  He usually has a BM daily.   Seen in the ED 03/15/2015 for blood in his stool. He underwent a CT scan which did not show a stone or hydronephrosis.  Treated for a UTI with Keflex.  He also had a whitish/yellow discharge which resolved.   04/08/2014 Bili 1.5, ALP 110, AST 33, ALT 48, Albumin 3.5. Urine: 2+bilirubin, WBC 6-10, RBC 3-10, Bacterial Moderate.  Urinalysis    Component Value Date/Time   COLORURINE YELLOW 03/31/2015 1912   APPEARANCEUR CLEAR 03/31/2015 1912   LABSPEC 1.015 03/31/2015 1912   PHURINE 5.5 03/31/2015 1912   GLUCOSEU NEGATIVE 03/31/2015 1912   HGBUR TRACE* 03/31/2015 1912   BILIRUBINUR MODERATE* 03/31/2015 1912   KETONESUR NEGATIVE 03/31/2015 1912   PROTEINUR 30* 03/31/2015 1912   NITRITE NEGATIVE 03/31/2015 1912   LEUKOCYTESUR NEGATIVE 03/31/2015 1912   03/15/2015 Urine negative for bilirubin   CBC    Component Value Date/Time   WBC 10.4 03/15/2015 2018   RBC 4.52 03/15/2015 2018   HGB 13.9 03/15/2015 2018   HCT 40.3 03/15/2015 2018   PLT 254 03/15/2015 2018   MCV 89.2 03/15/2015 2018   MCH 30.8 03/15/2015 2018   MCHC 34.5 03/15/2015 2018   RDW 13.3 03/15/2015 2018   LYMPHSABS 3.3 03/15/2015 2018   MONOABS 0.7 03/15/2015 2018   EOSABS 0.4 03/15/2015 2018   BASOSABS 0.1 03/15/2015 2018   No flowsheet data  found.       Review of Systems Past Medical History  Diagnosis Date  . Depression   . Hypertension   . Anxiety   . Diverticulitis   . High blood pressure   . Gout     No past surgical history on file.  Allergies  Allergen Reactions  . Shrimp [Shellfish Allergy]   . Sulfa Antibiotics     Current Outpatient Prescriptions on File Prior to Visit  Medication Sig Dispense Refill  . cyclobenzaprine (FLEXERIL) 10 MG tablet Take 1 tablet (10 mg total) by mouth 3 (three) times daily as needed. (Patient taking differently: Take 10 mg by mouth as needed. ) 21 tablet 0  . oxyCODONE-acetaminophen (PERCOCET/ROXICET) 5-325 MG tablet Take 1 tablet by mouth every 4 (four) hours as needed. 15 tablet 0   No current facility-administered medications on file prior to visit.        Objective:   Physical Exam Blood pressure 128/78, pulse 112, temperature 97.9 F (36.6 C), height 5\' 10"  (1.778 m), weight 320 lb 4.8 oz (145.287 kg). Alert and oriented. Skin warm and dry. Oral mucosa is moist.   . Sclera anicteric, conjunctivae is pink. Thyroid not enlarged. No cervical lymphadenopathy. Lungs clear. Heart regular rate and rhythm.  Abdomen is soft. Bowel sounds are positive. No hepatomegaly. No abdominal masses felt. No  tenderness.   edema to both ankles with redness noted.          Assessment & Plan:  Bilirubin in his urine on recent urinalysis. Will recheck this. ? Etiology. He had been on a fluid pill for swelling which he states is on hold. Recent hx of UTI and treated with Keflex. Urinalysis today and Hepatic function. Further recommendations to follow.

## 2015-04-12 NOTE — Patient Instructions (Signed)
Urine and Hepatic function

## 2015-04-12 NOTE — ED Notes (Signed)
Pt c/o redness and swelling to bilateral feet and ankles with pain and tenderness to ankles and feet x 2 weeks. Pt states he recently moved here and has not taken his medications x 2-3 weeks. nad noted.

## 2015-04-13 LAB — ANTINUCLEAR ANTIBODIES, IFA: ANA Ab, IFA: NEGATIVE

## 2015-04-13 LAB — URINALYSIS
Bilirubin Urine: NEGATIVE
GLUCOSE, UA: NEGATIVE
Nitrite: NEGATIVE
PH: 5.5 (ref 5.0–8.0)
Specific Gravity, Urine: 1.021 (ref 1.001–1.035)

## 2015-04-13 LAB — B. BURGDORFI ANTIBODIES: B burgdorferi Ab IgG+IgM: <0.91 {ISR} (ref 0.00–0.90)

## 2015-04-13 LAB — ANTI-SMOOTH MUSCLE ANTIBODY, IGG: F-Actin IgG: 12 Units (ref 0–19)

## 2015-04-14 LAB — URINE CULTURE

## 2015-04-18 ENCOUNTER — Encounter (HOSPITAL_COMMUNITY): Payer: Self-pay | Admitting: Cardiovascular Disease

## 2015-04-18 ENCOUNTER — Inpatient Hospital Stay (HOSPITAL_COMMUNITY): Payer: Medicaid Other

## 2015-04-18 ENCOUNTER — Encounter (HOSPITAL_COMMUNITY)
Admission: EM | Disposition: A | Discharge status: Home / Self Care | Payer: Self-pay | Source: Home / Self Care | Attending: Internal Medicine

## 2015-04-18 ENCOUNTER — Inpatient Hospital Stay (HOSPITAL_COMMUNITY)
Admission: EM | Admit: 2015-04-18 | Discharge: 2015-04-23 | DRG: 280 | Disposition: A | Discharge status: Home / Self Care | Payer: Medicaid Other | Attending: Internal Medicine | Admitting: Internal Medicine

## 2015-04-18 ENCOUNTER — Emergency Department (HOSPITAL_COMMUNITY): Payer: Medicaid Other

## 2015-04-18 DIAGNOSIS — R739 Hyperglycemia, unspecified: Secondary | ICD-10-CM | POA: Diagnosis present

## 2015-04-18 DIAGNOSIS — G8929 Other chronic pain: Secondary | ICD-10-CM | POA: Diagnosis present

## 2015-04-18 DIAGNOSIS — R079 Chest pain, unspecified: Secondary | ICD-10-CM | POA: Diagnosis not present

## 2015-04-18 DIAGNOSIS — E871 Hypo-osmolality and hyponatremia: Secondary | ICD-10-CM | POA: Diagnosis present

## 2015-04-18 DIAGNOSIS — I2119 ST elevation (STEMI) myocardial infarction involving other coronary artery of inferior wall: Secondary | ICD-10-CM | POA: Diagnosis present

## 2015-04-18 DIAGNOSIS — I10 Essential (primary) hypertension: Secondary | ICD-10-CM | POA: Diagnosis present

## 2015-04-18 DIAGNOSIS — G9340 Encephalopathy, unspecified: Secondary | ICD-10-CM | POA: Diagnosis present

## 2015-04-18 DIAGNOSIS — R197 Diarrhea, unspecified: Secondary | ICD-10-CM | POA: Diagnosis not present

## 2015-04-18 DIAGNOSIS — I959 Hypotension, unspecified: Secondary | ICD-10-CM | POA: Diagnosis not present

## 2015-04-18 DIAGNOSIS — Z978 Presence of other specified devices: Secondary | ICD-10-CM

## 2015-04-18 DIAGNOSIS — M109 Gout, unspecified: Secondary | ICD-10-CM | POA: Diagnosis present

## 2015-04-18 DIAGNOSIS — N170 Acute kidney failure with tubular necrosis: Secondary | ICD-10-CM

## 2015-04-18 DIAGNOSIS — M549 Dorsalgia, unspecified: Secondary | ICD-10-CM

## 2015-04-18 DIAGNOSIS — N19 Unspecified kidney failure: Secondary | ICD-10-CM | POA: Diagnosis present

## 2015-04-18 DIAGNOSIS — J9811 Atelectasis: Secondary | ICD-10-CM | POA: Diagnosis present

## 2015-04-18 DIAGNOSIS — Z6841 Body Mass Index (BMI) 40.0 and over, adult: Secondary | ICD-10-CM

## 2015-04-18 DIAGNOSIS — R579 Shock, unspecified: Secondary | ICD-10-CM | POA: Diagnosis not present

## 2015-04-18 DIAGNOSIS — I213 ST elevation (STEMI) myocardial infarction of unspecified site: Secondary | ICD-10-CM | POA: Diagnosis not present

## 2015-04-18 DIAGNOSIS — E785 Hyperlipidemia, unspecified: Secondary | ICD-10-CM | POA: Diagnosis present

## 2015-04-18 DIAGNOSIS — Z882 Allergy status to sulfonamides status: Secondary | ICD-10-CM | POA: Diagnosis not present

## 2015-04-18 DIAGNOSIS — N179 Acute kidney failure, unspecified: Secondary | ICD-10-CM | POA: Diagnosis present

## 2015-04-18 DIAGNOSIS — M5441 Lumbago with sciatica, right side: Secondary | ICD-10-CM | POA: Diagnosis not present

## 2015-04-18 DIAGNOSIS — E876 Hypokalemia: Secondary | ICD-10-CM | POA: Diagnosis present

## 2015-04-18 DIAGNOSIS — E1310 Other specified diabetes mellitus with ketoacidosis without coma: Secondary | ICD-10-CM | POA: Diagnosis present

## 2015-04-18 DIAGNOSIS — Z91013 Allergy to seafood: Secondary | ICD-10-CM | POA: Diagnosis not present

## 2015-04-18 DIAGNOSIS — R571 Hypovolemic shock: Secondary | ICD-10-CM | POA: Diagnosis present

## 2015-04-18 HISTORY — DX: Hyperlipidemia, unspecified: E78.5

## 2015-04-18 HISTORY — DX: Shock, unspecified: R57.9

## 2015-04-18 HISTORY — DX: Essential (primary) hypertension: I10

## 2015-04-18 LAB — DIFFERENTIAL
BASOS ABS: 0 10*3/uL (ref 0.0–0.1)
Basophils Relative: 0 %
EOS ABS: 0 10*3/uL (ref 0.0–0.7)
Eosinophils Relative: 0 %
LYMPHS ABS: 2.1 10*3/uL (ref 0.7–4.0)
Lymphocytes Relative: 8 %
MONOS PCT: 6 %
Monocytes Absolute: 1.5 10*3/uL — ABNORMAL HIGH (ref 0.1–1.0)
NEUTROS ABS: 22.1 10*3/uL — AB (ref 1.7–7.7)
NEUTROS PCT: 86 %

## 2015-04-18 LAB — BASIC METABOLIC PANEL
Anion gap: 29 — ABNORMAL HIGH (ref 5–15)
Anion gap: 29 — ABNORMAL HIGH (ref 5–15)
Anion gap: 27 — ABNORMAL HIGH (ref 5–15)
BUN: 135 mg/dL — ABNORMAL HIGH (ref 6–20)
BUN: 138 mg/dL — AB (ref 6–20)
BUN: 138 mg/dL — AB (ref 6–20)
BUN: 139 mg/dL — AB (ref 6–20)
CALCIUM: 7.9 mg/dL — AB (ref 8.9–10.3)
CALCIUM: 7.9 mg/dL — AB (ref 8.9–10.3)
CALCIUM: 7.8 mg/dL — AB (ref 8.9–10.3)
CHLORIDE: 76 mmol/L — AB (ref 101–111)
CO2: 15 mmol/L — AB (ref 22–32)
CO2: 14 mmol/L — ABNORMAL LOW (ref 22–32)
CO2: 14 mmol/L — AB (ref 22–32)
CO2: 17 mmol/L — ABNORMAL LOW (ref 22–32)
CREATININE: 6.62 mg/dL — AB (ref 0.61–1.24)
CREATININE: 6.62 mg/dL — AB (ref 0.61–1.24)
CREATININE: 6.16 mg/dL — AB (ref 0.61–1.24)
Calcium: 7.9 mg/dL — ABNORMAL LOW (ref 8.9–10.3)
Chloride: 77 mmol/L — ABNORMAL LOW (ref 101–111)
Chloride: 77 mmol/L — ABNORMAL LOW (ref 101–111)
Chloride: 76 mmol/L — ABNORMAL LOW (ref 101–111)
Creatinine, Ser: 7.46 mg/dL — ABNORMAL HIGH (ref 0.61–1.24)
GFR calc Af Amer: 11 mL/min — ABNORMAL LOW (ref >60)
GFR calc Af Amer: 12 mL/min — ABNORMAL LOW (ref >60)
GFR calc non Af Amer: 8 mL/min — ABNORMAL LOW (ref >60)
GFR, EST AFRICAN AMERICAN: 10 mL/min — AB (ref >60)
GFR, EST NON AFRICAN AMERICAN: 9 mL/min — AB (ref >60)
GFR, EST NON AFRICAN AMERICAN: 10 mL/min — AB (ref >60)
GLUCOSE: 168 mg/dL — AB (ref 65–99)
GLUCOSE: 168 mg/dL — AB (ref 65–99)
GLUCOSE: 178 mg/dL — AB (ref 65–99)
Glucose, Bld: 233 mg/dL — ABNORMAL HIGH (ref 65–99)
POTASSIUM: 4 mmol/L (ref 3.5–5.1)
POTASSIUM: 3.4 mmol/L — AB (ref 3.5–5.1)
Potassium: 3.4 mmol/L — ABNORMAL LOW (ref 3.5–5.1)
Potassium: 3.3 mmol/L — ABNORMAL LOW (ref 3.5–5.1)
SODIUM: 120 mmol/L — AB (ref 135–145)
SODIUM: 120 mmol/L — AB (ref 135–145)
Sodium: 120 mmol/L — ABNORMAL LOW (ref 135–145)
Sodium: 120 mmol/L — ABNORMAL LOW (ref 135–145)

## 2015-04-18 LAB — CBC
HEMATOCRIT: 46 % (ref 39.0–52.0)
HEMATOCRIT: 41.2 % (ref 39.0–52.0)
HEMOGLOBIN: 16.9 g/dL (ref 13.0–17.0)
Hemoglobin: 15 g/dL (ref 13.0–17.0)
MCH: 29.8 pg (ref 26.0–34.0)
MCH: 29.1 pg (ref 26.0–34.0)
MCHC: 36.7 g/dL — AB (ref 30.0–36.0)
MCHC: 36.4 g/dL — AB (ref 30.0–36.0)
MCV: 81 fL (ref 78.0–100.0)
MCV: 80 fL (ref 78.0–100.0)
PLATELETS: 769 10*3/uL — AB (ref 150–400)
Platelets: 1017 10*3/uL (ref 150–400)
RBC: 5.68 MIL/uL (ref 4.22–5.81)
RBC: 5.15 MIL/uL (ref 4.22–5.81)
RDW: 14.2 % (ref 11.5–15.5)
RDW: 14 % (ref 11.5–15.5)
WBC: 25.7 10*3/uL — AB (ref 4.0–10.5)
WBC: 23.9 10*3/uL — AB (ref 4.0–10.5)

## 2015-04-18 LAB — I-STAT ARTERIAL BLOOD GAS, ED
Acid-base deficit: 11 mmol/L — ABNORMAL HIGH (ref 0.0–2.0)
BICARBONATE: 14.7 meq/L — AB (ref 20.0–24.0)
O2 Saturation: 99 %
PCO2 ART: 30.5 mmHg — AB (ref 35.0–45.0)
PH ART: 7.287 — AB (ref 7.350–7.450)
PO2 ART: 175 mmHg — AB (ref 80.0–100.0)
Patient temperature: 36.1
TCO2: 16 mmol/L (ref 0–100)

## 2015-04-18 LAB — MAGNESIUM: MAGNESIUM: 3 mg/dL — AB (ref 1.7–2.4)

## 2015-04-18 LAB — HEPATIC FUNCTION PANEL
ALK PHOS: 111 U/L (ref 38–126)
ALT: 35 U/L (ref 17–63)
AST: 35 U/L (ref 15–41)
Albumin: 3.5 g/dL (ref 3.5–5.0)
BILIRUBIN DIRECT: 0.4 mg/dL (ref 0.1–0.5)
BILIRUBIN INDIRECT: 0.7 mg/dL (ref 0.3–0.9)
BILIRUBIN TOTAL: 1.1 mg/dL (ref 0.3–1.2)
Total Protein: 7.5 g/dL (ref 6.5–8.1)

## 2015-04-18 LAB — URINALYSIS, ROUTINE W REFLEX MICROSCOPIC
GLUCOSE, UA: 250 mg/dL — AB
KETONES UR: 15 mg/dL — AB
NITRITE: POSITIVE — AB
PH: 5 (ref 5.0–8.0)
Protein, ur: 30 mg/dL — AB
Specific Gravity, Urine: 1.027 (ref 1.005–1.030)

## 2015-04-18 LAB — I-STAT CG4 LACTIC ACID, ED: Lactic Acid, Venous: 3.97 mmol/L (ref 0.5–2.0)

## 2015-04-18 LAB — I-STAT TROPONIN, ED: Troponin i, poc: 0 ng/mL (ref 0.00–0.08)

## 2015-04-18 LAB — I-STAT CHEM 8, ED
BUN: 134 mg/dL — AB (ref 6–20)
CHLORIDE: 77 mmol/L — AB (ref 101–111)
CREATININE: 8.3 mg/dL — AB (ref 0.61–1.24)
Calcium, Ion: 0.75 mmol/L — ABNORMAL LOW (ref 1.12–1.23)
GLUCOSE: 328 mg/dL — AB (ref 65–99)
HCT: 57 % — ABNORMAL HIGH (ref 39.0–52.0)
Hemoglobin: 19.4 g/dL — ABNORMAL HIGH (ref 13.0–17.0)
Potassium: 4.6 mmol/L (ref 3.5–5.1)
Sodium: 112 mmol/L — CL (ref 135–145)
TCO2: 16 mmol/L (ref 0–100)

## 2015-04-18 LAB — LIPASE, BLOOD: Lipase: 51 U/L (ref 11–51)

## 2015-04-18 LAB — TROPONIN I: TROPONIN I: 0.04 ng/mL — AB (ref <0.031)

## 2015-04-18 LAB — GLUCOSE, CAPILLARY
GLUCOSE-CAPILLARY: 221 mg/dL — AB (ref 65–99)
GLUCOSE-CAPILLARY: 214 mg/dL — AB (ref 65–99)
Glucose-Capillary: 178 mg/dL — ABNORMAL HIGH (ref 65–99)
Glucose-Capillary: 178 mg/dL — ABNORMAL HIGH (ref 65–99)

## 2015-04-18 LAB — APTT: aPTT: 55 seconds — ABNORMAL HIGH (ref 24–37)

## 2015-04-18 LAB — RAPID URINE DRUG SCREEN, HOSP PERFORMED
Amphetamines: NOT DETECTED
BENZODIAZEPINES: NOT DETECTED
Barbiturates: NOT DETECTED
COCAINE: NOT DETECTED
OPIATES: NOT DETECTED
TETRAHYDROCANNABINOL: NOT DETECTED

## 2015-04-18 LAB — SODIUM, URINE, RANDOM: Sodium, Ur: <10 mmol/L

## 2015-04-18 LAB — URINE MICROSCOPIC-ADD ON: RBC / HPF: NONE SEEN RBC/hpf (ref 0–5)

## 2015-04-18 LAB — LACTIC ACID, PLASMA: LACTIC ACID, VENOUS: 1.3 mmol/L (ref 0.5–2.0)

## 2015-04-18 LAB — PROTIME-INR
INR: 1.48 (ref 0.00–1.49)
PROTHROMBIN TIME: 18 s — AB (ref 11.6–15.2)

## 2015-04-18 LAB — BETA-HYDROXYBUTYRIC ACID: Beta-Hydroxybutyric Acid: 0.32 mmol/L — ABNORMAL HIGH (ref 0.05–0.27)

## 2015-04-18 LAB — OSMOLALITY, URINE: Osmolality, Ur: 334 mOsm/kg (ref 300–900)

## 2015-04-18 LAB — CORTISOL: CORTISOL PLASMA: 96 ug/dL

## 2015-04-18 LAB — CREATININE, URINE, RANDOM: Creatinine, Urine: 173.18 mg/dL

## 2015-04-18 LAB — TSH: TSH: 2.437 u[IU]/mL (ref 0.350–4.500)

## 2015-04-18 LAB — PROCALCITONIN: PROCALCITONIN: 1.25 ng/mL

## 2015-04-18 LAB — MRSA PCR SCREENING: MRSA by PCR: NEGATIVE

## 2015-04-18 LAB — PHOSPHORUS: PHOSPHORUS: 10.5 mg/dL — AB (ref 2.5–4.6)

## 2015-04-18 SURGERY — LEFT HEART CATH AND CORONARY ANGIOGRAPHY

## 2015-04-18 MED ORDER — SODIUM CHLORIDE 0.9 % IV SOLN
INTRAVENOUS | Status: AC | PRN
  Administered 2015-04-18: 1000 mL via INTRAVENOUS

## 2015-04-18 MED ORDER — ONDANSETRON HCL 4 MG/2ML IJ SOLN
4.0000 mg | Freq: Four times a day (QID) | INTRAMUSCULAR | Status: DC | PRN
Start: 2015-04-18 — End: 2015-04-19
  Administered 2015-04-18 (×2): 4 mg via INTRAVENOUS
  Filled 2015-04-18 (×2): qty 2

## 2015-04-18 MED ORDER — SODIUM CHLORIDE 0.9 % IV SOLN: 250.0000 mL | INTRAVENOUS | Status: DC | PRN

## 2015-04-18 MED ORDER — ASPIRIN 81 MG PO CHEW: 324.0000 mg | CHEWABLE_TABLET | ORAL | Status: AC

## 2015-04-18 MED ORDER — DEXTROSE 5 % IV SOLN
INTRAVENOUS | Status: DC
  Administered 2015-04-18: 15:00:00 via INTRAVENOUS
  Filled 2015-04-18 (×4): qty 1000

## 2015-04-18 MED ORDER — PROMETHAZINE HCL 25 MG/ML IJ SOLN
12.5000 mg | Freq: Four times a day (QID) | INTRAMUSCULAR | Status: DC | PRN
  Administered 2015-04-18: 12.5 mg via INTRAVENOUS
  Filled 2015-04-18: qty 1

## 2015-04-18 MED ORDER — INSULIN ASPART 100 UNIT/ML ~~LOC~~ SOLN: 0.0000 [IU] | SUBCUTANEOUS | Status: DC

## 2015-04-18 MED ORDER — POTASSIUM CHLORIDE 10 MEQ/100ML IV SOLN
10.0000 meq | INTRAVENOUS | Status: AC
  Administered 2015-04-18 (×2): 10 meq via INTRAVENOUS
  Filled 2015-04-18 (×2): qty 100

## 2015-04-18 MED ORDER — HEPARIN SODIUM (PORCINE) 5000 UNIT/ML IJ SOLN
4000.0000 [IU] | INTRAMUSCULAR | Status: AC
  Administered 2015-04-18: 4000 [IU] via INTRAVENOUS

## 2015-04-18 MED ORDER — HEPARIN SODIUM (PORCINE) 5000 UNIT/ML IJ SOLN
INTRAMUSCULAR | Status: AC
  Filled 2015-04-18: qty 1

## 2015-04-18 MED ORDER — SODIUM CHLORIDE 0.9 % IV SOLN
INTRAVENOUS | Status: DC
  Filled 2015-04-18: qty 2.5

## 2015-04-18 MED ORDER — SODIUM CHLORIDE 0.9 % IV BOLUS (SEPSIS)
1000.0000 mL | Freq: Once | INTRAVENOUS | Status: AC
  Administered 2015-04-18: 1000 mL via INTRAVENOUS

## 2015-04-18 MED ORDER — ASPIRIN 300 MG RE SUPP: 300.0000 mg | RECTAL | Status: AC

## 2015-04-18 MED ORDER — INSULIN ASPART 100 UNIT/ML ~~LOC~~ SOLN
0.0000 [IU] | SUBCUTANEOUS | Status: DC
  Administered 2015-04-18: 3 [IU] via SUBCUTANEOUS
  Administered 2015-04-18: 5 [IU] via SUBCUTANEOUS
  Administered 2015-04-19: 3 [IU] via SUBCUTANEOUS
  Administered 2015-04-19: 2 [IU] via SUBCUTANEOUS
  Administered 2015-04-19: 3 [IU] via SUBCUTANEOUS

## 2015-04-18 MED ORDER — HEPARIN SODIUM (PORCINE) 5000 UNIT/ML IJ SOLN
5000.0000 [IU] | Freq: Three times a day (TID) | INTRAMUSCULAR | Status: DC
  Administered 2015-04-18 – 2015-04-23 (×14): 5000 [IU] via SUBCUTANEOUS
  Filled 2015-04-18 (×16): qty 1

## 2015-04-18 MED ORDER — POTASSIUM CHLORIDE 10 MEQ/100ML IV SOLN
10.0000 meq | INTRAVENOUS | Status: AC
Start: 2015-04-18 — End: 2015-04-18
  Administered 2015-04-18: 10 meq via INTRAVENOUS
  Filled 2015-04-18: qty 100

## 2015-04-18 MED ORDER — DEXTROSE-NACL 5-0.45 % IV SOLN
INTRAVENOUS | Status: DC
  Administered 2015-04-18: 16:00:00 via INTRAVENOUS

## 2015-04-18 MED ORDER — SODIUM CHLORIDE 0.9 % IV SOLN
INTRAVENOUS | Status: DC
  Administered 2015-04-18: 18:00:00 via INTRAVENOUS

## 2015-04-18 NOTE — H&P (Signed)
PULMONARY / CRITICAL CARE MEDICINE   Name: Charles Cohen MRN: [id number] DOB: December 23, 1975    ADMISSION DATE:  04/18/2015 CONSULTATION DATE:  1/22  REFERRING MD:  Clarene Duke   CHIEF COMPLAINT:  Acute renal failure and metabolic acidosis   HISTORY OF PRESENT ILLNESS:   This is a 40 year old male who recently presented to ED at [facility] on 12/19 for hematuria. Work-up included CT renal stone study which was negative. He was felt to have UTI & sent home w/ abx and noted resolution of symptoms. On 1/16 he presented to ER w/ bilateral LE swelling & pain, some shortness of breath and at this point some nausea. At this point his creatinine was 1.34.  He was treated w/ IVFs, had CT abd w/ contrast(to eval nausea and bilirubinemia?). Ultimately was diagnosed w/ gout flare and sent home w/ indomethacin and prednisone. His nausea continued to worsen over the following week. He had not been able to eat and drank little. Was taking his medications as prescribed. Presented to the ED after collapsing  w/ CC: constant pressure in the chest and upper abdomen. On initial presentation was found to have anteriolateral changes on EKG and was to go directly to cath lab but then lab work came back w/ scr of 8.3, BUN 134 and Na of 112. PCCM was asked to admit d/t his severe metabolic derangements.   PAST MEDICAL HISTORY :  He  has a past medical history of Shock (HCC) (04/18/2015); Hyperlipidemia (04/18/2015); and Hypertension (04/18/2015). Obesity, Depression, HTN, Gout, possible HIV exposure but Rapid HIV screen negative.  PAST SURGICAL HISTORY: He  has no past surgical history on file.  Allergies  Allergen Reactions  . Shrimp [Shellfish Allergy]     Headaches  . Sulfa Antibiotics Hives    No current facility-administered medications on file prior to encounter.   No current outpatient prescriptions on file prior to encounter.    FAMILY HISTORY:  His father has h/o renal failure   SOCIAL HISTORY: He is a monogamous  homosexual male, his husband goes by [employer]. No smoke, denies ETOH, un-employed   REVIEW OF SYSTEMS:   Unable d/t lethargy hx obtained from husband  SUBJECTIVE:  A little more awake   VITAL SIGNS: BP 129/64 mmHg  Pulse 82  Temp(Src) 97 F (36.1 C)  Resp 20  Ht  (1.778 m)  Wt 320 lb (145.151 kg)  BMI 45.92 kg/m2  SpO2 100%  HEMODYNAMICS:    VENTILATOR SETTINGS:    INTAKE / OUTPUT:    PHYSICAL EXAMINATION: General:  40 year old male, resting in bed no distress.  Neuro:  Awake, alert, no focal def, generalized weakness  HEENT:  NCAT, mucous membranes, tongue and lips dry and cracked  Cardiovascular:  rrr w/out MRG Lungs:  Clear and w/out focal def  Abdomen:  Soft, not tender, + bowel sounds  Musculoskeletal:  Intact w/ equal strength and bulk Skin:  Warm and dry   LABS:  BMET  Recent Labs Lab 04/18/15 1136 04/18/15 1246  NA 112* 120*  K 4.6 4.0  CL 77* 76*  CO2  --  15*  BUN 134* 135*  CREATININE 8.30* 7.46*  GLUCOSE 328* 233*    Electrolytes  Recent Labs Lab 04/18/15 1246  CALCIUM 7.9*    CBC  Recent Labs Lab 04/18/15 1128 04/18/15 1136  WBC 25.7*  --   HGB 16.9 19.4*  HCT 46.0 57.0*  PLT 1017*  --     Coag's  Recent  Labs Lab 04/18/15 1157  APTT 55*  INR 1.48    Sepsis Markers  Recent Labs Lab 04/18/15 1213  LATICACIDVEN 3.97*    ABG  Recent Labs Lab 04/18/15 1240  PHART 7.287*  PCO2ART 30.5*  PO2ART 175.0*    Liver Enzymes  Recent Labs Lab 04/18/15 1157  AST 35  ALT 35  ALKPHOS 111  BILITOT 1.1  ALBUMIN 3.5    Cardiac Enzymes No results for input(s): TROPONINI, PROBNP in the last 168 hours.  Glucose No results for input(s): GLUCAP in the last 168 hours.  Imaging Dg Chest Port 1 View  04/18/2015  CLINICAL DATA:  40 year old male code STEMI. Shortness of breath and diaphoresis. Initial encounter. EXAM: PORTABLE CHEST 1 VIEW COMPARISON:  None. FINDINGS: Portable AP supine view at 1153 hours.  Mildly low lung volumes. Mild curvilinear atelectasis at the left lung base. Otherwise when allowing for portable technique the lungs are clear. No pneumothorax. Visualized tracheal air column is within normal limits. No osseous abnormality identified. Normal cardiac size and mediastinal contours. IMPRESSION: Mild left lung base atelectasis. No other acute cardiopulmonary abnormality. Electronically Signed   By: Odessa Fleming M.D.   On: 04/18/2015 12:48     STUDIES:  CT abd/pelvis 1/16: 1. Colonic dilatation, likely due to ileus. No colonic wall thickening or pericolonic stranding to suggest colitis. Fluid in the colon consistent with diarrhea. There are few scattered colonic diverticuli with no diverticulitis. Renal US 1/16>>>  CULTURES: UC 1/22>>> BCX2 1/22>>>  ANTIBIOTICS:   SIGNIFICANT EVENTS:   LINES/TUBES:   DISCUSSION: 40 year old male admitted 1/22 w/ acute renal failure in setting of dehydration, NSAIDs, diuretics, CT dye load and ACE-I. Further c/b DKA and less likely STEMI (given neg CEs). Will admit to ICU. Aggressively hydrate. Transition to DKA protocol, and closely obs his blood chemistry and urine out-put at this point does not need renal involvement.   ASSESSMENT / PLAN:  PULMONARY A: Left basilar atelectasis  P:   pulm hygiene  Wean O2 Mobilize   CARDIOVASCULAR A:  Possible inferior STEMI H/o HTN  P:  Hold Ace-I and antihypertensives  Cards following Cont asa Add BB when hemodynamically able  RENAL A:   AKI in setting of Dehydration, IV CT dye load, NSAIDs, Diuretics and Ace-I. His baseline scr was 0.9 1 month ago.  DKA (+ beta-hydroxybutyric acid and +AGMA) Severe dehydration  >he is s/p 3 liters of NS fluid resuscitation. Feels much better. Scr already trending down. Initially looked like a gap/non-gap process from [place]. F/u formal labs show primarily +AGMA P:   Change IVFs from bicarb back to NS and start DKA protocol  Place foley Strict  I&O Renal US  Replace lytes as needed.  No need for renal x sult currently   GASTROINTESTINAL A:   Nausea in setting of uremia  P:   NPO IV hydration  PRN zofran   HEMATOLOGIC A:   Hemoconcentration: expect hgb to drop  Baseline hgb is 12.9, presents w/ hbg > 16 P:   heparin  Trend CBC Transfuse per ICU protocol   INFECTIOUS A:   No evidence of infxn P:   Trend CBC Urine and blood cultures ordered   ENDOCRINE A:   Hyperglycemia/DKA P:   DKA protocol  Ck A1C  NEUROLOGIC A:   Mild encephalopathy -->improved w/ IVFs P:   RASS goal: n/a Hold sedation   FAMILY  - Updates: husband updated at bedside   - Inter-disciplinary family meet or Palliative Care  meeting due by: 1/19  Simonne Martinet ACNP-BC [facility] Pulmonary/Critical Care Pager # [phone] OR # [phone] if no answer   04/18/2015, 1:40 PM  ,

## 2015-04-18 NOTE — Consult Note (Signed)
CARDIOLOGY CONSULT NOTE  Patient ID: Charles Cohen, MRN: [id number], DOB/AGE: 1976-02-20 40 y.o. Admit date: 04/18/2015 Date of Consult: 04/18/2015  Primary Physician: No primary care provider on file. Primary Cardiologist: none Referring Physician: Dr Clarene Duke  Chief Complaint: collapse  Reason for Consultation: Inferior STEMI  HPI: 40 year old gentleman with no past cardiac history. He has been feeling poorly for 1 week. He has had a constant pressure in the chest and upper abdomen. He's had poor intake of food and liquid. He complains of generalized weakness over the same time.. This morning he collapsed and EMS was called. On initial EMS arrival, they were unable to help 8 pulses were obtained blood pressure. They placed him on a nonrebreather and he began to respond. He was reportedly diaphoretic, cyanotic, and had an episode of vomiting. An EKG shows inferolateral ST segment elevation and a code STEMI was called from the field. I am evaluating the patient in the emergency room after his arrival here.  The patient is somnolent but arousable. He does answer questions appropriately. He denies shortness of breath. He complains of discomfort in his chest and abdomen. He admits to an episode of hematuria one week ago. After review of his clinical situation and EKG, I had initially planned on taking him to the cardiac catheterization lab because of suspicion of an inferolateral STEMI. However, his point-of-care labs resulted and demonstrate acute renal failure with a creatinine of 8.3 and sodium of 112. BUN is 134. Because of severe metabolic arrangements, he will be admitted by the critical care medicine service. His initial troponin is 0.0.  Medical History:  Past Medical History  Diagnosis Date  . Shock (HCC) 04/18/2015  . Hyperlipidemia 04/18/2015  . Hypertension 04/18/2015      Surgical History: No past surgical history on file.   Home Meds: Prior to Admission medications   Not on File     Inpatient Medications:   Allergies:  Allergies  Allergen Reactions  . Shrimp [Shellfish Allergy]     Headaches  . Sulfa Antibiotics Hives    Social History   Social History  . Marital Status: Married    Spouse Name: N/A  . Number of Children: N/A  . Years of Education: N/A   Occupational History  . Not on file.   Social History Main Topics  . Smoking status: Not on file  . Smokeless tobacco: Not on file  . Alcohol Use: Not on file  . Drug Use: Not on file  . Sexual Activity: Not on file   Other Topics Concern  . Not on file   Social History Narrative   Married   Not employed   No tobacco, illicit drugs, Etoh     Family History  Problem Relation Age of Onset  . Hypertension Father      Review of Systems: General: negative for chills, fever, night sweats  ENT: negative for rhinorrhea or epistaxis Cardiovascular: see HPI Dermatological: negative for rash Respiratory: negative for cough or wheezing GI: positive for nausea, vomiting, decreased appetite GU: positive for decreased urine output Neurologic: positive for syncope Heme: no easy bruising or bleeding Endo: negative for excessive thirst, thyroid disorder, or flushing Musculoskeletal: negative for joint pain or swelling, negative for myalgias  All other systems reviewed and are otherwise negative except as noted above.  Physical Exam: Blood pressure 106/85, pulse 85, resp. rate 22, height  (1.778 m), weight 320 lb (145.151 kg), SpO2 99 %. Pt is an obese male, ill-appearing HEENT:  normal Neck: JVP normal. Carotid upstrokes normal without bruits. No thyromegaly. Lungs: equal expansion, clear bilaterally CV: Apex is nonpalpable, RRR without murmur or gallop Abd: soft, NT, obese Back: no CVA tenderness Ext: no C/C/E        DP/PT pulses nonpalpable Skin: warm and dry without rash Neuro: no focal weakness    Labs: No results for input(s): CKTOTAL, CKMB, TROPONINI in the last 72  hours. Lab Results  Component Value Date   HGB 19.4* 04/18/2015   HCT 57.0* 04/18/2015     Recent Labs Lab 04/18/15 1136  NA 112*  K 4.6  CL 77*  BUN 134*  CREATININE 8.30*  GLUCOSE 328*   No results found for: CHOL, HDL, LDLCALC, TRIG No results found for: DDIMER  Radiology/Studies:  No results found.  EKG: NSR with inferolateral ST elevation, consider acute injury  ASSESSMENT AND PLAN:  1. Shock/collapse: broad differential dx at this point. Inferior STEMI with hypotension is a possibility but unusual presentation with one week of chest/abdominal discomfort, marked metabolic abnormalities with acute renal failure, and normal troponin make this less likely. Will follow patient closely with CCM team but at this point he is not a candidate for cardiac catheterization/primary PCI because of marked metabolic abnormality with hyponatremia and acute renal failure.  2. Acute renal failure: BUN/Cr 134/8.3 - per CCM/EDP  3. Hyponatremia: per CCM/EDP  Bedside echo is performed. Very limited imaging because of patients body habitus. I do not appreciate a pericardial effusion and I am not able to discern LV wall motion because of limited acoustic windows. Will order formal echo once he stabilizes. Would start on IV heparin after lines placed and continue until his cardiac situation is clarified with serial enzymes. Will follow with you.  SignedTonny Bollman MD, [facility] 04/18/2015, 12:14 PM

## 2015-04-18 NOTE — Progress Notes (Signed)
eLink Physician-Brief Progress Note Patient Name: Charles Cohen DOB: 1975-12-23 MRN: [id number]   Date of Service  04/18/2015  HPI/Events of Note  CBGs 160  eICU Interventions  Hold off insulin gtt, SSI - mod, dc D5 drip CXR - LIJ going into axilla, ok to usefor blood draws & fluids, not for pressors     Intervention Category Intermediate Interventions: Hyperglycemia - evaluation and treatment;Diagnostic test evaluation  Oswin Johal V. 04/18/2015, 5:43 PM

## 2015-04-18 NOTE — ED Notes (Signed)
C/o pain in chest, feels sob. Diaphoretic, elevation in leads 2,3 avf with ems.  No IV's establishsed en route.

## 2015-04-18 NOTE — ED Notes (Signed)
Given heparin 4000 units by [facility], rn

## 2015-04-18 NOTE — ED Notes (Signed)
Report attempted.  Will attempt again in 15 min

## 2015-04-18 NOTE — Procedures (Signed)
Central Venous Catheter Insertion Procedure Note Charles Cohen [id number] [birth year]  Procedure: Insertion of Central Venous Catheter Indications: Assessment of intravascular volume, Drug and/or fluid administration and Frequent blood sampling  Procedure Details Consent: Risks of procedure as well as the alternatives and risks of each were explained to the (patient/caregiver).  Consent for procedure obtained. Time Out: Verified patient identification, verified procedure, site/side was marked, verified correct patient position, special equipment/implants available, medications/allergies/relevent history reviewed, required imaging and test results available.  Performed Real time [place] was used to ID and cannulate the vessel  Maximum sterile technique was used including antiseptics, cap, gloves, gown, hand hygiene, mask and sheet. Skin prep: Chlorhexidine; local anesthetic administered A antimicrobial bonded/coated triple lumen catheter was placed in the left internal jugular vein using the Seldinger technique.  Evaluation Blood flow good Complications: No apparent complications Patient did tolerate procedure well. Chest X-ray ordered to verify placement.  CXR: pending.  Charles Cohen 04/18/2015, 4:52 PM

## 2015-04-18 NOTE — Progress Notes (Signed)
Placed on contact precautions to r/0 c diff

## 2015-04-18 NOTE — ED Provider Notes (Signed)
CSN: [id number]     Arrival date & time 04/18/15  1124 History   First MD Initiated Contact with Patient 04/18/15 1142     Chief Complaint  Patient presents with  . Code STEMI     (Consider location/radiation/quality/duration/timing/severity/associated sxs/prior Treatment) HPI Comments: 40 year old male with past medical history including hypertension, borderline diabetes who presents with syncope and hypotension. History limited because of the patient's severe illness and obtained primarily from EMS. EMS reports that they were called to the patient's residence after he collapsed this morning at home and was briefly unresponsive. He then immediately vomited. On their arrival, he was diaphoretic, cyanotic, although he was responsive. They were unable to palpate pulses are obtained blood pressure. Placed him on a nonrebreather and gave him 325 mg aspirin. An EKG was concerning for MI and STEMI alert was called in the field.   Patient endorses central, constant chest pain which he states he has had for several days. On later questioning, the patient notes that he has had a week of vomiting and diarrhea as well as feeling generally unwell. He has had decreased urination, last urine was yesterday. He notes 1 episode of hematuria several weeks ago. None currently.  The history is provided by the EMS personnel and the patient.    Past Medical History  Diagnosis Date  . Shock ([place]) 04/18/2015  . Hyperlipidemia 04/18/2015  . Hypertension 04/18/2015   No past surgical history on file. Family History  Problem Relation Age of Onset  . Hypertension Father    Social History  Substance Use Topics  . Smoking status: Not on file  . Smokeless tobacco: Not on file  . Alcohol Use: Not on file    Review of Systems  Unable to perform ROS: Acuity of condition      Allergies  Shrimp and Sulfa antibiotics  Home Medications   Prior to Admission medications   Not on File   BP 141/62 mmHg  Pulse  84  Resp 19  Ht 5' 10"  (1.778 m)  Wt 320 lb (145.151 kg)  BMI 45.92 kg/m2  SpO2 100% Physical Exam  Constitutional: He is oriented to person, place, and time. He appears distressed.  Morbidly obese male, pale, diaphoretic, somnolent, ill-appearing  HENT:  Head: Normocephalic and atraumatic.  dry mucous membranes, emesis around the mouth  Eyes: Conjunctivae are normal. Pupils are equal, round, and reactive to light.  Neck: Neck supple.  Cardiovascular: Normal rate, regular rhythm and normal heart sounds.   No murmur heard. Pulmonary/Chest: Effort normal and breath sounds normal. No respiratory distress.  Abdominal: Soft. Bowel sounds are normal. He exhibits no distension. There is no tenderness.  Musculoskeletal: He exhibits no edema.  Neurological: He is alert and oriented to person, place, and time.  To answer questions appropriately  Skin: Skin is warm. He is diaphoretic. There is pallor.  Candida inguinal folds  Nursing note and vitals reviewed.   ED Course  .Critical Care Performed by: Sharlett Iles Authorized by: Sharlett Iles Total critical care time: 45 minutes Critical care time was exclusive of separately billable procedures and treating other patients. Critical care was necessary to treat or prevent imminent or life-threatening deterioration of the following conditions: metabolic crisis, dehydration and cardiac failure. Critical care was time spent personally by me on the following activities: development of treatment plan with patient or surrogate, blood draw for specimens, discussions with consultants, evaluation of patient's response to treatment, examination of patient, obtaining history from patient or surrogate,  ordering and performing treatments and interventions, ordering and review of laboratory studies, ordering and review of radiographic studies and re-evaluation of patient's condition.   (including critical care time) Labs Review Labs  Reviewed  I-STAT CHEM 8, ED - Abnormal; Notable for the following:    Sodium 112 (*)    Chloride 77 (*)    BUN 134 (*)    Creatinine, Ser 8.30 (*)    Glucose, Bld 328 (*)    Calcium, Ion 0.75 (*)    Hemoglobin 19.4 (*)    HCT 57.0 (*)    All other components within normal limits  I-STAT CG4 LACTIC ACID, ED - Abnormal; Notable for the following:    Lactic Acid, Venous 3.97 (*)    All other components within normal limits  CULTURE, BLOOD (ROUTINE X 2)  CULTURE, BLOOD (ROUTINE X 2)  URINE CULTURE  CBC  DIFFERENTIAL  PROTIME-INR  APTT  BASIC METABOLIC PANEL  HEPATIC FUNCTION PANEL  LIPASE, BLOOD  SODIUM, URINE, RANDOM  CREATININE, URINE, RANDOM  TROPONIN I  TROPONIN I  TROPONIN I  LACTIC ACID, PLASMA  CBC  MAGNESIUM  PHOSPHORUS  BASIC METABOLIC PANEL  BASIC METABOLIC PANEL  PROCALCITONIN  CORTISOL  BLOOD GAS, ARTERIAL  URINALYSIS, ROUTINE W REFLEX MICROSCOPIC (NOT AT [facility])  BETA-HYDROXYBUTYRIC ACID  TROPONIN I  TROPONIN I  TROPONIN I  URINE RAPID DRUG SCREEN, HOSP PERFORMED  OSMOLALITY, URINE  TSH  HEMOGLOBIN A1C  I-STAT TROPOININ, ED    Imaging Review No results found. I have personally reviewed and evaluated these lab results as part of my medical decision-making.   EKG Interpretation   Date/Time:  Sunday April 18 2015 11:28:14 EST Ventricular Rate:  94 PR Interval:  148 QRS Duration: 111 QT Interval:  413 QTC Calculation: 516 R Axis:   71 Text Interpretation:  Sinus rhythm Abnormal R-wave progression, early  transition Probable inferior infarct, old Minimal ST elevation, anterior  leads Prolonged QT interval Baseline wander in lead(s) V1 ST elevation in  II, III, aVF concerning for acute STEMI Confirmed by Ahmaad Neidhardt MD, Iven Earnhart  ([id number]) on 04/18/2015 11:50:47 AM     Medications  0.9 %  sodium chloride infusion (not administered)  aspirin chewable tablet 324 mg (not administered)    Or  aspirin suppository 300 mg (not administered)  heparin  injection 5,000 Units (not administered)  ondansetron (ZOFRAN) injection 4 mg (not administered)  dextrose 5 % 1,000 mL with sodium bicarbonate 150 mEq infusion (not administered)  sodium chloride 0.9 % bolus 1,000 mL (not administered)  insulin aspart (novoLOG) injection 0-20 Units (not administered)  heparin injection 4,000 Units (4,000 Units Intravenous Given 04/18/15 1134)  heparin 5000 UNIT/ML injection (  Duplicate 0/76/80 8811)  0.9 %  sodium chloride infusion ( Intravenous Stopped 04/18/15 1206)  0.9 %  sodium chloride infusion ( Intravenous Stopped 04/18/15 1211)   Angiocath insertion Performed by: Wenda Overland Leshonda Galambos  Consent: Unable to obtain consent due to emergent condition   Time out: Immediately prior to procedure a "time out" was called to verify the correct patient, procedure, equipment, support staff and site/side marked as required.  Preparation: Patient was prepped w/ chloroprep  Vein Location: R EJ Confirmed EJ location with ultrasound prior to puncture  Gauge: 18  Normal blood return and flush without difficulty Patient tolerance: Patient tolerated the procedure well with no immediate complications.   MDM   Final diagnoses:  Acute renal failure, unspecified acute renal failure type (HCC)  Hyponatremia  Chest pain, unspecified  chest pain type   Patient brought in by EMS after collapsing at home and being found diaphoretic, somnolent, and hypotensive. On arrival, the patient was diaphoretic but able to state name. STEMI alert had been called in field. Initial vital signs showed BP 106/85, normal heart rate, O2 sat 86% on room air. Patient was placed on nonrebreather and pacer pads and obtained EKG. EKG showed ST elevation in inferior leads. Dr. Burt Knack with cardiology met the patient at bedside and reviewed his EKG. Bedside ultrasound showed no large pericardial effusion. Patient had received aspirin during transport and initiated heparin protocol per ACS as patient  denies any current hematuria or blood in stool. Placed EJ due to access difficulty and later performed femoral venupuncture for labs. Gave the patient 2 L of IV fluids.  Initial labs notable for i-STAT chem 8 showing sodium 112, chloride 77, BUN 134, creatinine 8.3, glucose 328. Initial troponin negative, lactate 3.97. Bedside ultrasound shows no distended bladder to suggest urinary obstruction. I consulted critical care, discussed case w/ Dr. Vaughan Browner. Dr. Burt Knack and I agreed that given the patient's significant metabolic derangements, possibly due to hypovolemia as the patient admits to a week of vomiting and diarrhea, the patient's primary problem appears to be metabolic. Given his profound hyponatremia and renal failure, Dr. Burt Knack recommended medical treatment for now until patient is stabilized. Pt admitted to ICU in critical condition.    Sharlett Iles, MD 04/18/15 [phone]

## 2015-04-18 NOTE — Progress Notes (Signed)
Central line location results called to Dr Vassie Loll in [place]. Line okay to use for iv fluids and insulin , no pressors.

## 2015-04-19 ENCOUNTER — Encounter (HOSPITAL_COMMUNITY): Payer: Self-pay

## 2015-04-19 DIAGNOSIS — I959 Hypotension, unspecified: Secondary | ICD-10-CM

## 2015-04-19 LAB — BASIC METABOLIC PANEL
ANION GAP: 26 — AB (ref 5–15)
ANION GAP: 19 — AB (ref 5–15)
Anion gap: 24 — ABNORMAL HIGH (ref 5–15)
Anion gap: 20 — ABNORMAL HIGH (ref 5–15)
BUN: 138 mg/dL — AB (ref 6–20)
BUN: 140 mg/dL — AB (ref 6–20)
BUN: 131 mg/dL — ABNORMAL HIGH (ref 6–20)
BUN: 132 mg/dL — ABNORMAL HIGH (ref 6–20)
CALCIUM: 7.4 mg/dL — AB (ref 8.9–10.3)
CALCIUM: 7.5 mg/dL — AB (ref 8.9–10.3)
CHLORIDE: 79 mmol/L — AB (ref 101–111)
CO2: 16 mmol/L — AB (ref 22–32)
CO2: 20 mmol/L — AB (ref 22–32)
CO2: 22 mmol/L (ref 22–32)
CO2: 24 mmol/L (ref 22–32)
CREATININE: 5.44 mg/dL — AB (ref 0.61–1.24)
CREATININE: 4.34 mg/dL — AB (ref 0.61–1.24)
CREATININE: 3.76 mg/dL — AB (ref 0.61–1.24)
Calcium: 7.8 mg/dL — ABNORMAL LOW (ref 8.9–10.3)
Calcium: 8.1 mg/dL — ABNORMAL LOW (ref 8.9–10.3)
Chloride: 77 mmol/L — ABNORMAL LOW (ref 101–111)
Chloride: 75 mmol/L — ABNORMAL LOW (ref 101–111)
Chloride: 78 mmol/L — ABNORMAL LOW (ref 101–111)
Creatinine, Ser: 6.74 mg/dL — ABNORMAL HIGH (ref 0.61–1.24)
GFR calc Af Amer: 11 mL/min — ABNORMAL LOW (ref >60)
GFR calc Af Amer: 14 mL/min — ABNORMAL LOW (ref >60)
GFR calc Af Amer: 18 mL/min — ABNORMAL LOW (ref >60)
GFR calc non Af Amer: 9 mL/min — ABNORMAL LOW (ref >60)
GFR calc non Af Amer: 12 mL/min — ABNORMAL LOW (ref >60)
GFR calc non Af Amer: 16 mL/min — ABNORMAL LOW (ref >60)
GFR calc non Af Amer: 19 mL/min — ABNORMAL LOW (ref >60)
GFR, EST AFRICAN AMERICAN: 22 mL/min — AB (ref >60)
GLUCOSE: 217 mg/dL — AB (ref 65–99)
GLUCOSE: 175 mg/dL — AB (ref 65–99)
GLUCOSE: 184 mg/dL — AB (ref 65–99)
Glucose, Bld: 180 mg/dL — ABNORMAL HIGH (ref 65–99)
POTASSIUM: 3.4 mmol/L — AB (ref 3.5–5.1)
POTASSIUM: 3.1 mmol/L — AB (ref 3.5–5.1)
Potassium: 3.3 mmol/L — ABNORMAL LOW (ref 3.5–5.1)
Potassium: 3.2 mmol/L — ABNORMAL LOW (ref 3.5–5.1)
Sodium: 119 mmol/L — CL (ref 135–145)
Sodium: 119 mmol/L — CL (ref 135–145)
Sodium: 120 mmol/L — ABNORMAL LOW (ref 135–145)
Sodium: 122 mmol/L — ABNORMAL LOW (ref 135–145)

## 2015-04-19 LAB — CBC
HEMATOCRIT: 36.7 % — AB (ref 39.0–52.0)
HEMOGLOBIN: 12.8 g/dL — AB (ref 13.0–17.0)
MCH: 27.9 pg (ref 26.0–34.0)
MCHC: 34.9 g/dL (ref 30.0–36.0)
MCV: 80.1 fL (ref 78.0–100.0)
Platelets: 634 10*3/uL — ABNORMAL HIGH (ref 150–400)
RBC: 4.58 MIL/uL (ref 4.22–5.81)
RDW: 14.2 % (ref 11.5–15.5)
WBC: 16.2 10*3/uL — AB (ref 4.0–10.5)

## 2015-04-19 LAB — GLUCOSE, CAPILLARY
GLUCOSE-CAPILLARY: 172 mg/dL — AB (ref 65–99)
GLUCOSE-CAPILLARY: 166 mg/dL — AB (ref 65–99)
GLUCOSE-CAPILLARY: 179 mg/dL — AB (ref 65–99)
GLUCOSE-CAPILLARY: 149 mg/dL — AB (ref 65–99)
Glucose-Capillary: 151 mg/dL — ABNORMAL HIGH (ref 65–99)
Glucose-Capillary: 195 mg/dL — ABNORMAL HIGH (ref 65–99)

## 2015-04-19 LAB — HEMOGLOBIN A1C
HEMOGLOBIN A1C: 6.7 % — AB (ref 4.8–5.6)
Mean Plasma Glucose: 146 mg/dL

## 2015-04-19 LAB — URINE CULTURE: Culture: NO GROWTH

## 2015-04-19 LAB — PATHOLOGIST SMEAR REVIEW

## 2015-04-19 LAB — PHOSPHORUS: PHOSPHORUS: 7.3 mg/dL — AB (ref 2.5–4.6)

## 2015-04-19 LAB — MAGNESIUM: Magnesium: 2.5 mg/dL — ABNORMAL HIGH (ref 1.7–2.4)

## 2015-04-19 LAB — TROPONIN I: Troponin I: <0.03 ng/mL (ref <0.031)

## 2015-04-19 MED ORDER — INSULIN ASPART 100 UNIT/ML ~~LOC~~ SOLN: 0.0000 [IU] | Freq: Every day | SUBCUTANEOUS | Status: DC

## 2015-04-19 MED ORDER — SODIUM CHLORIDE 0.9 % IV SOLN
2.0000 g | Freq: Once | INTRAVENOUS | Status: AC
  Administered 2015-04-19: 2 g via INTRAVENOUS
  Filled 2015-04-19: qty 20

## 2015-04-19 MED ORDER — PROMETHAZINE HCL 25 MG/ML IJ SOLN
12.5000 mg | Freq: Four times a day (QID) | INTRAMUSCULAR | Status: DC | PRN
  Administered 2015-04-19: 12.5 mg via INTRAVENOUS
  Filled 2015-04-19: qty 1

## 2015-04-19 MED ORDER — INSULIN ASPART 100 UNIT/ML ~~LOC~~ SOLN
0.0000 [IU] | Freq: Three times a day (TID) | SUBCUTANEOUS | Status: DC
  Administered 2015-04-19 (×2): 2 [IU] via SUBCUTANEOUS
  Administered 2015-04-20 (×2): 1 [IU] via SUBCUTANEOUS
  Administered 2015-04-20: 2 [IU] via SUBCUTANEOUS
  Administered 2015-04-21: 1 [IU] via SUBCUTANEOUS
  Administered 2015-04-21: 2 [IU] via SUBCUTANEOUS
  Administered 2015-04-21 – 2015-04-23 (×5): 1 [IU] via SUBCUTANEOUS

## 2015-04-19 MED ORDER — INFLUENZA VAC SPLIT QUAD 0.5 ML IM SUSY
0.5000 mL | PREFILLED_SYRINGE | INTRAMUSCULAR | Status: AC
  Administered 2015-04-20: 0.5 mL via INTRAMUSCULAR
  Filled 2015-04-19: qty 0.5

## 2015-04-19 MED ORDER — POTASSIUM CHLORIDE 10 MEQ/50ML IV SOLN
10.0000 meq | INTRAVENOUS | Status: AC
  Administered 2015-04-19 (×4): 10 meq via INTRAVENOUS
  Filled 2015-04-19 (×4): qty 50

## 2015-04-19 MED ORDER — SODIUM CHLORIDE 0.9 % IV SOLN
INTRAVENOUS | Status: DC
  Administered 2015-04-19 – 2015-04-23 (×6): via INTRAVENOUS

## 2015-04-19 MED ORDER — CETYLPYRIDINIUM CHLORIDE 0.05 % MT LIQD
7.0000 mL | Freq: Two times a day (BID) | OROMUCOSAL | Status: DC
  Administered 2015-04-19 – 2015-04-23 (×5): 7 mL via OROMUCOSAL

## 2015-04-19 MED ORDER — POTASSIUM CHLORIDE 10 MEQ/100ML IV SOLN: 10.0000 meq | INTRAVENOUS | Status: DC

## 2015-04-19 MED ORDER — POTASSIUM CHLORIDE 10 MEQ/100ML IV SOLN
10.0000 meq | INTRAVENOUS | Status: AC
Start: 2015-04-19 — End: 2015-04-19
  Administered 2015-04-19 (×3): 10 meq via INTRAVENOUS
  Filled 2015-04-19 (×3): qty 100

## 2015-04-19 MED ORDER — CHLORHEXIDINE GLUCONATE 0.12 % MT SOLN
15.0000 mL | Freq: Two times a day (BID) | OROMUCOSAL | Status: DC
  Administered 2015-04-19 – 2015-04-23 (×5): 15 mL via OROMUCOSAL
  Filled 2015-04-19 (×2): qty 15

## 2015-04-19 MED ORDER — ONDANSETRON HCL 4 MG/2ML IJ SOLN
4.0000 mg | INTRAMUSCULAR | Status: DC | PRN
  Administered 2015-04-19 (×2): 4 mg via INTRAVENOUS
  Filled 2015-04-19: qty 2

## 2015-04-19 NOTE — Progress Notes (Signed)
PULMONARY / CRITICAL CARE MEDICINE   Name: Charles Cohen MRN: [id number] DOB: Apr 18, 1975    BRIEF PATIENT DESCRIPTION: 40 year old male admitted 1/22 w/ acute renal failure in setting of dehydration, NSAIDs, diuretics, CT dye load and ACE-I. Further c/b DKA and less likely STEMI (given neg CEs).  SUBJECTIVE: Still nauseous Reports having diarrhea at home SCr trending down  VITAL SIGNS: Temp:  [96.6 F (35.9 C)-98.4 F (36.9 C)] 98.2 F (36.8 C) (01/23 0426) Pulse Rate:  [81-98] 91 (01/23 0715) Resp:  [13-22] 19 (01/23 0715) BP: (84-149)/(40-85) 98/56 mmHg (01/23 0715) SpO2:  [86 %-100 %] 99 % (01/23 0715) Weight:  [145.151 kg (320 lb)-147.2 kg (324 lb 8.3 oz)] 147.2 kg (324 lb 8.3 oz) (01/23 0500) HEMODYNAMICS:   VENTILATOR SETTINGS:   INTAKE / OUTPUT: Intake/Output      01/22 0701 - 01/23 0700 01/23 0701 - 01/24 0700   I.V. (mL/kg) 4100 (27.9)    IV Piggyback 400    Total Intake(mL/kg) 4500 (30.6)    Urine (mL/kg/hr) 1040    Total Output 1040     Net +3460            PHYSICAL EXAMINATION: General: obese male resting in bed, no acute distress Neuro: AAOx4 HEENT: NCAT, EOMI Cardiovascular: RRR, S1 and S2 appreciated  Lungs: Anterior lung fields clear to auscultation  Abdomen: Obese, +BS, mildly tender to palpation in the right upper quadrant. No rebound or rigidity.  Musculoskeletal: No gross deformities. No edema. Skin: Warm and dry   LABS:  CBC  Recent Labs Lab 04/18/15 1128 04/18/15 1136 04/18/15 1521  WBC 25.7*  --  23.9*  HGB 16.9 19.4* 15.0  HCT 46.0 57.0* 41.2  PLT 1017*  --  769*   Coag's  Recent Labs Lab 04/18/15 1157  APTT 55*  INR 1.48   BMET  Recent Labs Lab 04/18/15 1732 04/18/15 1913 04/19/15 0127  NA 119* 120* 119*  K 3.4* 3.3* 3.3*  CL 77* 76* 75*  CO2 16* 17* 20*  BUN 138* 139* 140*  CREATININE 6.74* 6.16* 5.44*  GLUCOSE 217* 178* 175*   Electrolytes  Recent Labs Lab 04/18/15 1521  04/18/15 1732 04/18/15 1913  04/19/15 0127  CALCIUM 7.9*  < > 7.8* 7.8* 7.4*  MG 3.0*  --   --   --   --   PHOS 10.5*  --   --   --   --   < > = values in this interval not displayed. Sepsis Markers  Recent Labs Lab 04/18/15 1213 04/18/15 1638 04/18/15 1732  LATICACIDVEN 3.97* 1.3  --   PROCALCITON  --   --  1.25   ABG  Recent Labs Lab 04/18/15 1240  PHART 7.287*  PCO2ART 30.5*  PO2ART 175.0*   Liver Enzymes  Recent Labs Lab 04/18/15 1157  AST 35  ALT 35  ALKPHOS 111  BILITOT 1.1  ALBUMIN 3.5   Cardiac Enzymes  Recent Labs Lab 04/18/15 1521 04/18/15 1733 04/19/15 0128  TROPONINI 0.04* <0.03 <0.03   Glucose  Recent Labs Lab 04/18/15 1407 04/18/15 1525 04/18/15 1748 04/18/15 2033 04/19/15 0044 04/19/15 0424  GLUCAP 221* 178* 214* 178* 151* 172*    Imaging US Renal Port  04/18/2015  CLINICAL DATA:  Back pain. Urinary tract infection. Acute renal failure. EXAM: RENAL / URINARY TRACT ULTRASOUND COMPLETE COMPARISON:  None. FINDINGS: Right Kidney: Length: 11.4 cm. Normal renal cortical thickness and echogenicity without focal lesions or hydronephrosis. Left Kidney: Length: 12.5 cm. Normal renal  cortical thickness and echogenicity without focal lesions or hydronephrosis. Simple 17 mm upper pole cyst. Bladder: Decompressed by Foley catheter. IMPRESSION: Unremarkable renal ultrasound examination. No renal lesions, renal calculi or hydronephrosis. Electronically Signed   By: Rudie Meyer M.D.   On: 04/18/2015 17:42   Dg Chest Port 1 View  04/18/2015  CLINICAL DATA:  Central line placement, shortness of breath today EXAM: PORTABLE CHEST 1 VIEW COMPARISON:  04/18/2015 FINDINGS: Mild left lower lobe discoid atelectasis. Heart size and vascular pattern normal. No pneumothorax. It appears that there has been a left internal jugular central line placed. Rather than extending centrally into the superior vena cava, the line extends peripherally likely within the subclavian vein into the left axillary  vein. IMPRESSION: In appropriate position of left central line as described above. Recommend withdrawing the line and replacing followed by repeat chest radiograph. Critical Value/emergent results were called by telephone at the time of interpretation on 04/18/2015 at 5:27 pm to Dr. [facility], the patient's nurse , who verbally acknowledged these results. Electronically Signed   By: Esperanza Heir M.D.   On: 04/18/2015 17:29   Dg Chest Port 1 View  04/18/2015  CLINICAL DATA:  40 year old male code STEMI. Shortness of breath and diaphoresis. Initial encounter. EXAM: PORTABLE CHEST 1 VIEW COMPARISON:  None. FINDINGS: Portable AP supine view at 1153 hours. Mildly low lung volumes. Mild curvilinear atelectasis at the left lung base. Otherwise when allowing for portable technique the lungs are clear. No pneumothorax. Visualized tracheal air column is within normal limits. No osseous abnormality identified. Normal cardiac size and mediastinal contours. IMPRESSION: Mild left lung base atelectasis. No other acute cardiopulmonary abnormality. Electronically Signed   By: Odessa Fleming M.D.   On: 04/18/2015 12:48    PULMONARY A: Left basilar atelectasis  P:  pulm hygiene  Wean O2 Mobilize   CARDIOVASCULAR A:  Possible inferior STEMI H/o HTN  >Troponin trending down  P:  Hold Ace-I and antihypertensives  Cards following Cont asa Add BB when hemodynamically able  RENAL A:  AKI in setting of Dehydration, IV CT dye load, NSAIDs, Diuretics and Ace-I. His baseline scr was 0.9 a month ago.  DKA (+ beta-hydroxybutyric acid and +AGMA) Severe dehydration  >Scr trending down P:  DKA protocol  Foley Strict I&Os Replace lytes as needed.   GASTROINTESTINAL A:  Nausea in setting of uremia  >LFTs normal  P:  NPO IV hydration  PRN zofran   HEMATOLOGIC A:  Hemoconcentration: expect hgb and platelets to drop  Baseline hgb is 12.9, presents w/ hbg > 16 P:  [place] heparin  Trend  CBC Transfuse per ICU protocol   INFECTIOUS A:  No evidence of infxn >white count trending down  P:  Trend CBC Urine and blood cultures pending   ENDOCRINE A:  Hyperglycemia/DKA Anion gap metabolic acidosis - bicarb improving, gap slowly closing  Hyponatremia Hypokalemia  Hypochloremia Hypocalcemia  Hyperphosphatemia  P:  Continue IVF for Na correction  Replete electrolytes prn  DKA protocol  Ck A1C  NEUROLOGIC A:  Mild encephalopathy -->improved w/ IVFs P:  RASS goal: n/a Hold sedation   FAMILY  - Updates: husband updated at bedside   I have personally obtained a history, examined the patient, evaluated laboratory and imaging results, formulated the assessment and plan and placed orders. CRITICAL CARE: The patient is critically ill with multiple organ systems failure and requires high complexity decision making for assessment and support, frequent evaluation and titration of therapies, application of advanced monitoring technologies and  extensive interpretation of multiple databases. Critical Care Time devoted to patient care services described in this note is  minutes.    Pulmonary and Critical Care Medicine [facility] Pager: [phone]  04/19/2015, 7:35 AM

## 2015-04-19 NOTE — Progress Notes (Signed)
[place] TEAM 1 - Stepdown/ICU TEAM PROGRESS NOTE  Charles Cohen ZOX:[id number] DOB: May 16, 1975 DOA: 04/18/2015 PCP: No primary care provider on file.  Admit HPI / Brief Narrative: 40 year old male who presented to the ED at [facility] on 12/19 for hematuria. Work-up included CT renal stone study which was negative. He was felt to have UTI & sent home w/ abx and noted resolution of symptoms. On 1/16 he returned to the ER w/ bilateral LE swelling & pain, some shortness of breath and some nausea. His creatinine was 1.34. He was treated w/ IVFs, had CT abd w/ contrast (to eval nausea and bilirubinemia?), and ultimately was diagnosed w/ a gout flare and sent home w/ indomethacin and prednisone. His nausea continued to worsen over the following week. He had not been able to eat or drink much. He returned to to the ED 1/21 after collapsing w/ constant pressure in the chest and upper abdomen. On initial presentation was found to have anteriolateral changes on EKG and was to go directly to cath lab but then lab work came back w/ Cr of 8.3, BUN 134 and Na of 112.  HPI/Subjective: The pt is more alert.  He c/o generalized body aches and unrelenting nausea.  He feels a heaviness in his chest which he describes more as a sense of SOB.  He denies HA or abdom pain.    Assessment/Plan:  Acute Kidney failure in setting of Dehydration, IV dye load, NSAIDs, Diuretics and Ace-I baseline cr 0.05 Mar 2015 - crt slowly improving - cont to hydrate - follow trend - no hydronephrosis on [place] - good UOP for now   DKA v/s starvation ketosis - Severe dehydration  + beta-hydroxybutyric acid and +AGMA - did not require insulin gtt - follow bicarb/lytes   Hyponatremia Most c/w hypovolemic hyponatremia - hydrate and follow   Possible inferior STEMI Cards following - cardiac enzymes essentially normal - TTE pending   Mild encephalopathy  Improving already - likely mostly due to uremia - follow  Hyperglycemia -  A1C - could also  be related to starvation ketosis   Hypokalemia Carefully replace and follow   Hyperphosphatemia Due to ARF - recheck in AM   H/o HTN  Not an active issue presently   Nausea in setting of uremia   Hemoconcentration Baseline hgb is 12.9 - presented w/ hbg > 16 - Hgb declining w/ hydration as expected   Morbid Obesity - Body mass index is 46.56 kg/(m^2).  Code Status: FULL Family Communication: no family present at time of exam Disposition Plan: SDU   Consultants: [facility]   Procedures: 1/22 Renal US - no hydronephrosis   Antibiotics: none  DVT prophylaxis: SQ heparin   Objective: Blood pressure 86/50, pulse 91, temperature 98.2 F (36.8 C), temperature source Oral, resp. rate 19, height  (1.778 m), weight 147.2 kg (324 lb 8.3 oz), SpO2 100 %.  Intake/Output Summary (Last 24 hours) at 04/19/15 0850 Last data filed at 04/19/15 0700  Gross per 24 hour  Intake   4500 ml  Output   1040 ml  Net   3460 ml   Exam: General: No acute respiratory distress - alert - mildly confused/slow Lungs: Clear to auscultation bilaterally without wheezes or crackles Cardiovascular: Regular rate and rhythm without murmur gallop or rub normal S1 and S2 Abdomen: Nontender, obese, soft, bowel sounds positive, no rebound, no ascites, no appreciable mass Extremities: No significant cyanosis, or clubbing; 1+edema bilateral lower extremities  Data Reviewed:  Basic Metabolic Panel:  Recent Labs Lab 04/18/15 1521 04/18/15 1637 04/18/15 1732 04/18/15 1913 04/19/15 0127  NA 120* 120* 119* 120* 119*  K 3.4* 3.4* 3.4* 3.3* 3.3*  CL 77* 77* 77* 76* 75*  CO2 14* 14* 16* 17* 20*  GLUCOSE 168* 168* 217* 178* 175*  BUN 138* 138* 138* 139* 140*  CREATININE 6.62* 6.62* 6.74* 6.16* 5.44*  CALCIUM 7.9* 7.9* 7.8* 7.8* 7.4*  MG 3.0*  --   --   --   --   PHOS 10.5*  --   --   --   --     CBC:  Recent Labs Lab 04/18/15 1128 04/18/15 1136 04/18/15 1521  WBC 25.7*  --  23.9*   NEUTROABS 22.1*  --   --   HGB 16.9 19.4* 15.0  HCT 46.0 57.0* 41.2  MCV 81.0  --  80.0  PLT 1017*  --  769*    Liver Function Tests:  Recent Labs Lab 04/18/15 1157  AST 35  ALT 35  ALKPHOS 111  BILITOT 1.1  PROT 7.5  ALBUMIN 3.5    Recent Labs Lab 04/18/15 1157  LIPASE 51   Coags:  Recent Labs Lab 04/18/15 1157  INR 1.48    Recent Labs Lab 04/18/15 1157  APTT 55*    Cardiac Enzymes:  Recent Labs Lab 04/18/15 1521 04/18/15 1733 04/19/15 0128  TROPONINI 0.04* <0.03 <0.03    CBG:  Recent Labs Lab 04/18/15 1525 04/18/15 1748 04/18/15 2033 04/19/15 0044 04/19/15 0424  GLUCAP 178* 214* 178* 151* 172*    Recent Results (from the past 240 hour(s))  MRSA PCR Screening     Status: None   Collection Time: 04/18/15  2:30 PM  Result Value Ref Range Status   MRSA by PCR NEGATIVE NEGATIVE Final    Comment:        The GeneXpert MRSA Assay (FDA approved for NASAL specimens only), is one component of a comprehensive MRSA colonization surveillance program. It is not intended to diagnose MRSA infection nor to guide or monitor treatment for MRSA infections.   Culture, blood (routine x 2)     Status: None (Preliminary result)   Collection Time: 04/18/15  8:04 PM  Result Value Ref Range Status   Specimen Description BLOOD LEFT ARM  Final   Special Requests BOTTLES DRAWN AEROBIC ONLY 5CC  Final   Culture PENDING  Incomplete   Report Status PENDING  Incomplete     Studies:   Recent x-ray studies have been reviewed in detail by the Attending Physician  Scheduled Meds:  Scheduled Meds: . calcium gluconate  2 g Intravenous Once  . heparin  5,000 Units Subcutaneous 3 times per day  . insulin aspart  0-15 Units Subcutaneous 6 times per day    Time spent on care of this patient: 35 mins   MCCLUNG,JEFFREY T , MD   [facility] Office  [phone] Pager - Text Page per Loretha Stapler as per below:  On-Call/Text Page:      [email or web address]       password [email or web address]  If 7PM-7AM, please contact night-coverage [email or web address] Password [email or web address] 04/19/2015, 8:50 AM   LOS: 1 day

## 2015-04-19 NOTE — Clinical Documentation Improvement (Signed)
Cardiology  Can the diagnosis of Shock be further specified?   Shock, including Type:  Septic, Cardiogenic, Hyper/Hypoglycemic, Hypovolemic, Hemorrhagic, Neurogenic, Anaphylactic, Other type, including suspected or known cause and/or associated condition(s)  Other  Clinically Undetermined  Document any associated diagnoses/conditions. Please update your documentation within the medical record to reflect your response to this query. Thank you.  Supporting Information: (As per progress note) " Shock",  "On their arrival, he was diaphoretic, cyanotic, although he was responsive."   Please exercise your independent, professional judgment when responding. A specific answer is not anticipated or expected.  Thank You, Nevin Bloodgood, RN, BSN, CCDS,Clinical Documentation Specialist:  [phone]  [phone]=Cell [place]- Health Information Management

## 2015-04-20 DIAGNOSIS — E785 Hyperlipidemia, unspecified: Secondary | ICD-10-CM

## 2015-04-20 DIAGNOSIS — I10 Essential (primary) hypertension: Secondary | ICD-10-CM

## 2015-04-20 DIAGNOSIS — N179 Acute kidney failure, unspecified: Secondary | ICD-10-CM | POA: Diagnosis present

## 2015-04-20 DIAGNOSIS — M5441 Lumbago with sciatica, right side: Secondary | ICD-10-CM

## 2015-04-20 DIAGNOSIS — N19 Unspecified kidney failure: Secondary | ICD-10-CM | POA: Diagnosis present

## 2015-04-20 DIAGNOSIS — E871 Hypo-osmolality and hyponatremia: Secondary | ICD-10-CM | POA: Diagnosis present

## 2015-04-20 DIAGNOSIS — M549 Dorsalgia, unspecified: Secondary | ICD-10-CM | POA: Diagnosis present

## 2015-04-20 DIAGNOSIS — E876 Hypokalemia: Secondary | ICD-10-CM | POA: Diagnosis present

## 2015-04-20 LAB — CBC
HCT: 36.2 % — ABNORMAL LOW (ref 39.0–52.0)
Hemoglobin: 12.5 g/dL — ABNORMAL LOW (ref 13.0–17.0)
MCH: 28.3 pg (ref 26.0–34.0)
MCHC: 34.5 g/dL (ref 30.0–36.0)
MCV: 82.1 fL (ref 78.0–100.0)
Platelets: 566 10*3/uL — ABNORMAL HIGH (ref 150–400)
RBC: 4.41 MIL/uL (ref 4.22–5.81)
RDW: 14.1 % (ref 11.5–15.5)
WBC: 12.9 10*3/uL — ABNORMAL HIGH (ref 4.0–10.5)

## 2015-04-20 LAB — GLUCOSE, CAPILLARY
GLUCOSE-CAPILLARY: 162 mg/dL — AB (ref 65–99)
GLUCOSE-CAPILLARY: 167 mg/dL — AB (ref 65–99)
GLUCOSE-CAPILLARY: 149 mg/dL — AB (ref 65–99)
Glucose-Capillary: 145 mg/dL — ABNORMAL HIGH (ref 65–99)
Glucose-Capillary: 149 mg/dL — ABNORMAL HIGH (ref 65–99)

## 2015-04-20 LAB — COMPREHENSIVE METABOLIC PANEL
ALT: 31 U/L (ref 17–63)
AST: 21 U/L (ref 15–41)
Albumin: 2.9 g/dL — ABNORMAL LOW (ref 3.5–5.0)
Alkaline Phosphatase: 98 U/L (ref 38–126)
Anion gap: 15 (ref 5–15)
BUN: 105 mg/dL — ABNORMAL HIGH (ref 6–20)
CO2: 21 mmol/L — ABNORMAL LOW (ref 22–32)
Calcium: 8 mg/dL — ABNORMAL LOW (ref 8.9–10.3)
Chloride: 91 mmol/L — ABNORMAL LOW (ref 101–111)
Creatinine, Ser: 2.14 mg/dL — ABNORMAL HIGH (ref 0.61–1.24)
GFR calc Af Amer: 43 mL/min — ABNORMAL LOW (ref >60)
GFR calc non Af Amer: 37 mL/min — ABNORMAL LOW (ref >60)
Glucose, Bld: 180 mg/dL — ABNORMAL HIGH (ref 65–99)
Potassium: 2.9 mmol/L — ABNORMAL LOW (ref 3.5–5.1)
Sodium: 127 mmol/L — ABNORMAL LOW (ref 135–145)
Total Bilirubin: 0.9 mg/dL (ref 0.3–1.2)
Total Protein: 6.4 g/dL — ABNORMAL LOW (ref 6.5–8.1)

## 2015-04-20 MED ORDER — POTASSIUM CHLORIDE 10 MEQ/100ML IV SOLN
10.0000 meq | INTRAVENOUS | Status: AC
  Administered 2015-04-20 (×4): 10 meq via INTRAVENOUS
  Filled 2015-04-20 (×4): qty 100

## 2015-04-20 NOTE — Progress Notes (Signed)
[place] TEAM 1 - Stepdown/ICU TEAM Progress Note  Charles Cohen ZOX:[id number] DOB: 1975-11-25 DOA: 04/18/2015 PCP: No primary care provider on file.  Admit HPI / Brief Narrative: 40 year old male WM PMHx Chronic Back Pain, Gout  Presented to the ED at [facility] on 12/19 for hematuria. Work-up included CT renal stone study which was negative. He was felt to have UTI & sent home w/ abx and noted resolution of symptoms. On 1/16 he returned to the ER w/ bilateral LE swelling & pain, some shortness of breath and some nausea. His creatinine was 1.34. He was treated w/ IVFs, had CT abd w/ contrast (to eval nausea and bilirubinemia?), and ultimately was diagnosed w/ a gout flare and sent home w/ indomethacin and prednisone. His nausea continued to worsen over the following week. He had not been able to eat or drink much. He returned to to the ED 1/21 after collapsing w/ constant pressure in the chest and upper abdomen. On initial presentation was found to have anteriolateral changes on EKG and was to go directly to cath lab but then lab work came back w/ Cr of 8.3, BUN 134 and Na of 112.   HPI/Subjective: 1/24  A/O 4, NAD. Negative N/V, negative CP, negative SOB  Assessment/Plan:  Acute Kidney failure in setting of Dehydration, IV dye load, NSAIDs, Diuretics and Ace-I (baseline cr 0.05 Mar 2015) -On admission Cr= 7.46--> 2.14  -follow trend; no hydronephrosis on [place] - Strict in and out since admission +2.1 L  -Continue normal saline 159ml/hr  DKA v/s starvation ketosis - Severe dehydration  + beta-hydroxybutyric acid and +AGMA - did not require insulin gtt - follow bicarb/lytes   Hyponatremia -Improving with hydration    Possible inferior STEMI -Cards following - cardiac enzymes essentially normal  - TTE pending   Mild encephalopathy  -Improving already - likely mostly due to uremia - follow  Hyperglycemia -  -1/22 hemoglobin A1c = 6.7 ; could also be related to starvation ketosis. At worst  meets criteria for control diabetic   Hypokalemia -Potassium IV 40 mEq    Hyperphosphatemia -Due to ARF - recheck in AM   H/o HTN  Not an active issue presently   Nausea in setting of uremia  -Resolved  Hemoconcentration -Baseline hgb is 12.9 - presented w/ hbg > 16 - Hgb declining w/ hydration as expected  -Back to baseline  Morbid Obesity - Body mass index is 46.56 kg/(m^2).   Code Status: FULL Family Communication: no family present at time of exam Disposition Plan: Home with parents    Consultants: Dr.Murali Ramaswamy PCCM  Procedure/Significant Events:    Culture NA  Antibiotics: NA  DVT prophylaxis: Subcutaneous heparin   Devices NA   LINES / TUBES:  Left IJ/22>>    Continuous Infusions: . sodium chloride 125 mL/hr at 04/20/15 1900    Objective: VITAL SIGNS: Temp: 98.3 F (36.8 C) (01/24 2004) Temp Source: Oral (01/24 2004) BP: 121/64 mmHg (01/24 2004) Pulse Rate: 90 (01/24 2004) SPO2; FIO2:   Intake/Output Summary (Last 24 hours) at 04/20/15 2036 Last data filed at 04/20/15 1900  Gross per 24 hour  Intake   4755 ml  Output   4850 ml  Net    -95 ml     Exam: General: A/O 4, NAD, No acute respiratory distress Eyes: Negative headache, ,negative scleral hemorrhage ENT: Negative Runny nose, negative gingival bleeding, Neck:  Negative scars, masses, torticollis, lymphadenopathy, JVD Lungs: Clear to auscultation bilaterally without wheezes or crackles  Cardiovascular: Regular rate and rhythm without murmur gallop or rub normal S1 and S2 Abdomen:negative abdominal pain, nondistended, positive soft, bowel sounds, no rebound, no ascites, no appreciable mass Extremities: No significant cyanosis, clubbing, or edema bilateral lower extremities Psychiatric:  Negative depression, negative anxiety, negative fatigue, negative mania  Neurologic:  Cranial nerves II through XII intact, tongue/uvula midline, all extremities muscle strength  5/5, sensation intact throughout, negative dysarthria, negative expressive aphasia, negative receptive aphasia.   Data Reviewed: Basic Metabolic Panel:  Recent Labs Lab 04/18/15 1521  04/18/15 1913 04/19/15 0127 04/19/15 0910 04/19/15 1400 04/20/15 0339  NA 120*  < > 120* 119* 120* 122* 127*  K 3.4*  < > 3.3* 3.3* 3.2* 3.1* 2.9*  CL 77*  < > 76* 75* 78* 79* 91*  CO2 14*  < > 17* 20* 22 24 21*  GLUCOSE 168*  < > 178* 175* 184* 180* 180*  BUN 138*  < > 139* 140* 131* 132* 105*  CREATININE 6.62*  < > 6.16* 5.44* 4.34* 3.76* 2.14*  CALCIUM 7.9*  < > 7.8* 7.4* 7.5* 8.1* 8.0*  MG 3.0*  --   --   --  2.5*  --   --   PHOS 10.5*  --   --   --  7.3*  --   --   < > = values in this interval not displayed. Liver Function Tests:  Recent Labs Lab 04/18/15 1157 04/20/15 0339  AST 35 21  ALT 35 31  ALKPHOS 111 98  BILITOT 1.1 0.9  PROT 7.5 6.4*  ALBUMIN 3.5 2.9*    Recent Labs Lab 04/18/15 1157  LIPASE 51   No results for input(s): AMMONIA in the last 168 hours. CBC:  Recent Labs Lab 04/18/15 1128 04/18/15 1136 04/18/15 1521 04/19/15 0910 04/20/15 0339  WBC 25.7*  --  23.9* 16.2* 12.9*  NEUTROABS 22.1*  --   --   --   --   HGB 16.9 19.4* 15.0 12.8* 12.5*  HCT 46.0 57.0* 41.2 36.7* 36.2*  MCV 81.0  --  80.0 80.1 82.1  PLT 1017*  --  769* 634* 566*   Cardiac Enzymes:  Recent Labs Lab 04/18/15 1521 04/18/15 1733 04/19/15 0128  TROPONINI 0.04* <0.03 <0.03   BNP (last 3 results) No results for input(s): BNP in the last 8760 hours.  ProBNP (last 3 results) No results for input(s): PROBNP in the last 8760 hours.  CBG:  Recent Labs Lab 04/19/15 2108 04/20/15 0828 04/20/15 1250 04/20/15 1559 04/20/15 1827  GLUCAP 149* 145* 162* 167* 149*    Recent Results (from the past 240 hour(s))  Urine culture     Status: None   Collection Time: 04/18/15 12:26 PM  Result Value Ref Range Status   Specimen Description URINE, CATHETERIZED  Final   Special Requests  NONE  Final   Culture NO GROWTH 1 DAY  Final   Report Status 04/19/2015 FINAL  Final  MRSA PCR Screening     Status: None   Collection Time: 04/18/15  2:30 PM  Result Value Ref Range Status   MRSA by PCR NEGATIVE NEGATIVE Final    Comment:        The GeneXpert MRSA Assay (FDA approved for NASAL specimens only), is one component of a comprehensive MRSA colonization surveillance program. It is not intended to diagnose MRSA infection nor to guide or monitor treatment for MRSA infections.   Culture, blood (routine x 2)     Status: None (Preliminary result)  Collection Time: 04/18/15  3:21 PM  Result Value Ref Range Status   Specimen Description BLOOD RIGHT ANTECUBITAL  Final   Special Requests BOTTLES DRAWN AEROBIC ONLY 6CC  Final   Culture NO GROWTH 2 DAYS  Final   Report Status PENDING  Incomplete  Culture, blood (routine x 2)     Status: None (Preliminary result)   Collection Time: 04/18/15  8:04 PM  Result Value Ref Range Status   Specimen Description BLOOD LEFT ARM  Final   Special Requests BOTTLES DRAWN AEROBIC ONLY 5CC  Final   Culture NO GROWTH 2 DAYS  Final   Report Status PENDING  Incomplete     Studies:  Recent x-ray studies have been reviewed in detail by the Attending Physician  Scheduled Meds:  Scheduled Meds: . antiseptic oral rinse  7 mL Mouth Rinse q12n4p  . chlorhexidine  15 mL Mouth Rinse BID  . heparin  5,000 Units Subcutaneous 3 times per day  . insulin aspart  0-5 Units Subcutaneous QHS  . insulin aspart  0-9 Units Subcutaneous TID WC    Time spent on care of this patient: 40 mins   Joevon Holliman, Roselind Messier , MD  [facility] Office  [phone] Pager - [phone]  On-Call/Text Page:      [email or web address]      password [email or web address]  If 7PM-7AM, please contact night-coverage [email or web address] Password [facility] 04/20/2015, 8:36 PM   LOS: 2 days   Care during the described time interval was provided by me .  I have reviewed this patient's available data,  including medical history, events of note, physical examination, and all test results as part of my evaluation. I have personally reviewed and interpreted all radiology studies.   Carolyne Littles, MD [phone] Pager

## 2015-04-21 DIAGNOSIS — N19 Unspecified kidney failure: Secondary | ICD-10-CM

## 2015-04-21 DIAGNOSIS — E871 Hypo-osmolality and hyponatremia: Secondary | ICD-10-CM

## 2015-04-21 DIAGNOSIS — E876 Hypokalemia: Secondary | ICD-10-CM

## 2015-04-21 LAB — COMPREHENSIVE METABOLIC PANEL
ALK PHOS: 85 U/L (ref 38–126)
ALT: 35 U/L (ref 17–63)
AST: 25 U/L (ref 15–41)
Albumin: 2.8 g/dL — ABNORMAL LOW (ref 3.5–5.0)
Anion gap: 16 — ABNORMAL HIGH (ref 5–15)
BUN: 44 mg/dL — ABNORMAL HIGH (ref 6–20)
CALCIUM: 8.1 mg/dL — AB (ref 8.9–10.3)
CO2: 22 mmol/L (ref 22–32)
CREATININE: 1.06 mg/dL (ref 0.61–1.24)
Chloride: 97 mmol/L — ABNORMAL LOW (ref 101–111)
Glucose, Bld: 144 mg/dL — ABNORMAL HIGH (ref 65–99)
Potassium: 3 mmol/L — ABNORMAL LOW (ref 3.5–5.1)
Sodium: 135 mmol/L (ref 135–145)
TOTAL PROTEIN: 6.1 g/dL — AB (ref 6.5–8.1)
Total Bilirubin: 0.8 mg/dL (ref 0.3–1.2)

## 2015-04-21 LAB — PHOSPHORUS: PHOSPHORUS: 1.4 mg/dL — AB (ref 2.5–4.6)

## 2015-04-21 LAB — GLUCOSE, CAPILLARY
GLUCOSE-CAPILLARY: 139 mg/dL — AB (ref 65–99)
GLUCOSE-CAPILLARY: 138 mg/dL — AB (ref 65–99)
Glucose-Capillary: 137 mg/dL — ABNORMAL HIGH (ref 65–99)
Glucose-Capillary: 151 mg/dL — ABNORMAL HIGH (ref 65–99)

## 2015-04-21 LAB — MAGNESIUM: MAGNESIUM: 2.7 mg/dL — AB (ref 1.7–2.4)

## 2015-04-21 MED ORDER — POTASSIUM CHLORIDE CRYS ER 20 MEQ PO TBCR
40.0000 meq | EXTENDED_RELEASE_TABLET | Freq: Once | ORAL | Status: AC
  Administered 2015-04-21: 40 meq via ORAL
  Filled 2015-04-21: qty 2

## 2015-04-21 MED ORDER — ASPIRIN EC 81 MG PO TBEC
81.0000 mg | DELAYED_RELEASE_TABLET | Freq: Every day | ORAL | Status: DC
  Administered 2015-04-21 – 2015-04-23 (×3): 81 mg via ORAL
  Filled 2015-04-21 (×3): qty 1

## 2015-04-21 NOTE — Progress Notes (Signed)
[place] TEAM 1 - Stepdown/ICU TEAM PROGRESS NOTE  Charles Cohen WUJ:[id number] DOB: [birth year] DOA: 04/18/2015 PCP: No primary care provider on file.  Admit HPI / Brief Narrative: 40 year old male who presented to the ED at [facility] on 12/19 for hematuria. Work-up included CT renal stone study which was negative. He was felt to have UTI & sent home w/ abx and noted resolution of symptoms. On 1/16 he returned to the ER w/ bilateral LE swelling & pain, some shortness of breath and some nausea. His creatinine was 1.34. He was treated w/ IVFs, had CT abd w/ contrast (to eval nausea and bilirubinemia?), and ultimately was diagnosed w/ a gout flare and sent home w/ indomethacin and prednisone. His nausea continued to worsen over the following week. He had not been able to eat or drink much. He returned to to the ED 1/21 after collapsing w/ constant pressure in the chest and upper abdomen. On initial presentation was found to have anteriolateral changes on EKG and was to go directly to cath lab but then lab work came back w/ Cr of 8.3, BUN 134 and Na of 112.  HPI/Subjective: The pt is alert and pleasant.  He denies cp, sob, or abdom pain.  He states he feels weak in general, and mildly nauseated.    Assessment/Plan:  Acute Kidney failure in setting of Dehydration, IV dye load, NSAIDs, Diuretics and Ace-I  baseline cr 0.05 Mar 2015 - crt continues to improve - cont to hydrate - follow trend - no hydronephrosis on [place] - good UOP continues   Hypovolemic Shock  Resolved w/ volume resuscitation   DKA v/s starvation ketosis - Severe dehydration  + beta-hydroxybutyric acid and +AGMA - did not require insulin gtt - follow bicarb/lytes   Hyponatremia Most c/w hypovolemic hyponatremia - resolved w/ hydration    Possible inferior STEMI Cards evaluated - cardiac enzymes essentially normal - no cp   Mild encephalopathy  likely mostly due to uremia - steadily improving   Hyperglycemia > Newly appreciated  DM A1C 6.7, meeting criteria for DM - adjust diet - begin education - follow CBG w/ SSI for now - if renal function continues to improve may be a candidate for oral meds at time of d/c (if meds required)  Hypokalemia Carefully replace and follow   Hyperphosphatemia Due to ARF - follow trend   H/o HTN  Not an active issue presently   Nausea in setting of uremia   Hemoconcentration Baseline hgb is 12.9 - presented w/ hbg > 16 - Hgb has normalized w/ volume resuscitation   Morbid Obesity - Body mass index is 44.49 kg/(m^2).  Code Status: FULL Family Communication: no family present at time of exam Disposition Plan: stable for transfer to med bed - begin PT/OT - ambulate - educate on DM - follow renal fxn/lytes - possible D/C 48hrs    Consultants: [facility] Cardiology   Procedures: 1/22 Renal US - no hydronephrosis   Antibiotics: none  DVT prophylaxis: SQ heparin   Objective: Blood pressure 107/49, pulse 97, temperature 98.1 F (36.7 C), temperature source Oral, resp. rate 24, height  (1.778 m), weight 140.66 kg (310 lb 1.6 oz), SpO2 98 %.  Intake/Output Summary (Last 24 hours) at 04/21/15 1512 Last data filed at 04/21/15 1435  Gross per 24 hour  Intake   4140 ml  Output   3400 ml  Net    740 ml   Exam: General: No acute respiratory distress  Lungs: Clear to  auscultation bilaterally without wheezes/crackles Cardiovascular: Regular rate and rhythm without murmur gallop or rub  Abdomen: Nontender, morbidly obese, soft, bowel sounds positive, no rebound, no ascites, no appreciable mass Extremities: No significant cyanosis, or clubbing; 1+ edema B LE   Data Reviewed:  Basic Metabolic Panel:  Recent Labs Lab 04/18/15 1521  04/19/15 0127 04/19/15 0910 04/19/15 1400 04/20/15 0339 04/21/15 0533  NA 120*  < > 119* 120* 122* 127* 135  K 3.4*  < > 3.3* 3.2* 3.1* 2.9* 3.0*  CL 77*  < > 75* 78* 79* 91* 97*  CO2 14*  < > 20* 22 24 21* 22  GLUCOSE 168*  < > 175*  184* 180* 180* 144*  BUN 138*  < > 140* 131* 132* 105* 44*  CREATININE 6.62*  < > 5.44* 4.34* 3.76* 2.14* 1.06  CALCIUM 7.9*  < > 7.4* 7.5* 8.1* 8.0* 8.1*  MG 3.0*  --   --  2.5*  --   --  2.7*  PHOS 10.5*  --   --  7.3*  --   --  1.4*  < > = values in this interval not displayed.  CBC:  Recent Labs Lab 04/18/15 1128 04/18/15 1136 04/18/15 1521 04/19/15 0910 04/20/15 0339  WBC 25.7*  --  23.9* 16.2* 12.9*  NEUTROABS 22.1*  --   --   --   --   HGB 16.9 19.4* 15.0 12.8* 12.5*  HCT 46.0 57.0* 41.2 36.7* 36.2*  MCV 81.0  --  80.0 80.1 82.1  PLT 1017*  --  769* 634* 566*    Liver Function Tests:  Recent Labs Lab 04/18/15 1157 04/20/15 0339 04/21/15 0533  AST 35 21 25  ALT 35 31 35  ALKPHOS 111 98 85  BILITOT 1.1 0.9 0.8  PROT 7.5 6.4* 6.1*  ALBUMIN 3.5 2.9* 2.8*    Recent Labs Lab 04/18/15 1157  LIPASE 51   Coags:  Recent Labs Lab 04/18/15 1157  INR 1.48    Recent Labs Lab 04/18/15 1157  APTT 55*    Cardiac Enzymes:  Recent Labs Lab 04/18/15 1521 04/18/15 1733 04/19/15 0128  TROPONINI 0.04* <0.03 <0.03    CBG:  Recent Labs Lab 04/20/15 1559 04/20/15 1827 04/20/15 2225 04/21/15 0818 04/21/15 1225  GLUCAP 167* 149* 149* 137* 151*    Recent Results (from the past 240 hour(s))  Urine culture     Status: None   Collection Time: 04/18/15 12:26 PM  Result Value Ref Range Status   Specimen Description URINE, CATHETERIZED  Final   Special Requests NONE  Final   Culture NO GROWTH 1 DAY  Final   Report Status 04/19/2015 FINAL  Final  MRSA PCR Screening     Status: None   Collection Time: 04/18/15  2:30 PM  Result Value Ref Range Status   MRSA by PCR NEGATIVE NEGATIVE Final    Comment:        The GeneXpert MRSA Assay (FDA approved for NASAL specimens only), is one component of a comprehensive MRSA colonization surveillance program. It is not intended to diagnose MRSA infection nor to guide or monitor treatment for MRSA infections.    Culture, blood (routine x 2)     Status: None (Preliminary result)   Collection Time: 04/18/15  3:21 PM  Result Value Ref Range Status   Specimen Description BLOOD RIGHT ANTECUBITAL  Final   Special Requests BOTTLES DRAWN AEROBIC ONLY 6CC  Final   Culture NO GROWTH 3 DAYS  Final  Report Status PENDING  Incomplete  Culture, blood (routine x 2)     Status: None (Preliminary result)   Collection Time: 04/18/15  8:04 PM  Result Value Ref Range Status   Specimen Description BLOOD LEFT ARM  Final   Special Requests BOTTLES DRAWN AEROBIC ONLY 5CC  Final   Culture NO GROWTH 3 DAYS  Final   Report Status PENDING  Incomplete     Studies:   Recent x-ray studies have been reviewed in detail by the Attending Physician  Scheduled Meds:  Scheduled Meds: . antiseptic oral rinse  7 mL Mouth Rinse q12n4p  . chlorhexidine  15 mL Mouth Rinse BID  . heparin  5,000 Units Subcutaneous 3 times per day  . insulin aspart  0-5 Units Subcutaneous QHS  . insulin aspart  0-9 Units Subcutaneous TID WC    Time spent on care of this patient: 35 mins   Samoria Fedorko T , MD   [facility] Office  [phone] Pager - Text Page per Loretha Stapler as per below:  On-Call/Text Page:      [email or web address]      password [email or web address]  If 7PM-7AM, please contact night-coverage [email or web address] Password [email or web address] 04/21/2015, 3:12 PM   LOS: 3 days

## 2015-04-22 DIAGNOSIS — G9340 Encephalopathy, unspecified: Secondary | ICD-10-CM | POA: Diagnosis present

## 2015-04-22 DIAGNOSIS — R739 Hyperglycemia, unspecified: Secondary | ICD-10-CM | POA: Diagnosis present

## 2015-04-22 DIAGNOSIS — R197 Diarrhea, unspecified: Secondary | ICD-10-CM

## 2015-04-22 LAB — CBC
HEMATOCRIT: 38.7 % — AB (ref 39.0–52.0)
HEMOGLOBIN: 13.1 g/dL (ref 13.0–17.0)
MCH: 28.8 pg (ref 26.0–34.0)
MCHC: 33.9 g/dL (ref 30.0–36.0)
MCV: 85.1 fL (ref 78.0–100.0)
Platelets: 326 10*3/uL (ref 150–400)
RBC: 4.55 MIL/uL (ref 4.22–5.81)
RDW: 14.5 % (ref 11.5–15.5)
WBC: 12.3 10*3/uL — AB (ref 4.0–10.5)

## 2015-04-22 LAB — GLUCOSE, CAPILLARY
GLUCOSE-CAPILLARY: 145 mg/dL — AB (ref 65–99)
GLUCOSE-CAPILLARY: 113 mg/dL — AB (ref 65–99)
Glucose-Capillary: 112 mg/dL — ABNORMAL HIGH (ref 65–99)
Glucose-Capillary: 114 mg/dL — ABNORMAL HIGH (ref 65–99)

## 2015-04-22 LAB — BASIC METABOLIC PANEL
ANION GAP: 10 (ref 5–15)
BUN: 19 mg/dL (ref 6–20)
CALCIUM: 8.9 mg/dL (ref 8.9–10.3)
CHLORIDE: 105 mmol/L (ref 101–111)
CO2: 21 mmol/L — AB (ref 22–32)
Creatinine, Ser: 0.92 mg/dL (ref 0.61–1.24)
GFR calc non Af Amer: >60 mL/min (ref >60)
Glucose, Bld: 153 mg/dL — ABNORMAL HIGH (ref 65–99)
POTASSIUM: 3.4 mmol/L — AB (ref 3.5–5.1)
Sodium: 136 mmol/L (ref 135–145)

## 2015-04-22 LAB — MAGNESIUM
Magnesium: 2 mg/dL (ref 1.7–2.4)
Magnesium: 1.8 mg/dL (ref 1.7–2.4)

## 2015-04-22 LAB — COMPREHENSIVE METABOLIC PANEL
ALBUMIN: 2.6 g/dL — AB (ref 3.5–5.0)
ALT: 41 U/L (ref 17–63)
ANION GAP: 11 (ref 5–15)
AST: 26 U/L (ref 15–41)
Alkaline Phosphatase: 81 U/L (ref 38–126)
BUN: 23 mg/dL — ABNORMAL HIGH (ref 6–20)
CO2: 22 mmol/L (ref 22–32)
Calcium: 8.7 mg/dL — ABNORMAL LOW (ref 8.9–10.3)
Chloride: 105 mmol/L (ref 101–111)
Creatinine, Ser: 0.81 mg/dL (ref 0.61–1.24)
GFR calc Af Amer: >60 mL/min (ref >60)
GFR calc non Af Amer: >60 mL/min (ref >60)
GLUCOSE: 125 mg/dL — AB (ref 65–99)
POTASSIUM: 3.2 mmol/L — AB (ref 3.5–5.1)
SODIUM: 138 mmol/L (ref 135–145)
Total Bilirubin: 0.7 mg/dL (ref 0.3–1.2)
Total Protein: 5.8 g/dL — ABNORMAL LOW (ref 6.5–8.1)

## 2015-04-22 LAB — PHOSPHORUS
PHOSPHORUS: 1.4 mg/dL — AB (ref 2.5–4.6)
Phosphorus: <1 mg/dL — CL (ref 2.5–4.6)

## 2015-04-22 LAB — C DIFFICILE QUICK SCREEN W PCR REFLEX
C DIFFICILE (CDIFF) TOXIN: NEGATIVE
C DIFFICLE (CDIFF) ANTIGEN: NEGATIVE
C Diff interpretation: NEGATIVE

## 2015-04-22 MED ORDER — SODIUM CHLORIDE 0.9% FLUSH
10.0000 mL | INTRAVENOUS | Status: DC | PRN
  Administered 2015-04-23: 10 mL
  Filled 2015-04-22: qty 40

## 2015-04-22 MED ORDER — LIVING WELL WITH DIABETES BOOK
Freq: Once | Status: AC
  Administered 2015-04-22: 1
  Filled 2015-04-22: qty 1

## 2015-04-22 MED ORDER — SODIUM PHOSPHATE 3 MMOLE/ML IV SOLN
40.0000 meq | Freq: Once | INTRAVENOUS | Status: AC
  Administered 2015-04-22: 40 meq via INTRAVENOUS
  Filled 2015-04-22: qty 10

## 2015-04-22 MED ORDER — POTASSIUM CHLORIDE CRYS ER 20 MEQ PO TBCR
60.0000 meq | EXTENDED_RELEASE_TABLET | Freq: Once | ORAL | Status: AC
  Administered 2015-04-22: 60 meq via ORAL
  Filled 2015-04-22: qty 3

## 2015-04-22 NOTE — Evaluation (Signed)
Occupational Therapy Evaluation Patient Details Name: Charles Cohen MRN: [id number] DOB: [birth year] Today's Date: 04/22/2015    History of Present Illness Pt admitted with severe renal failure and shock.  PMH: back pain, gout, HLD, HTN, depression, obesity.   Clinical Impression   Pt was independent in ADL, occasionally used a cane when his gout flared.  Pt presents with generalized weakness and impaired standing balance requiring supervision. Pt with difficulty with pericare. Educated in use of toileting tongs.  Will follow acutely.  Pt plans to go home with his parents in [place] at discharge.      Follow Up Recommendations  No OT follow up    Equipment Recommendations  None recommended by OT    Recommendations for Other Services       Precautions / Restrictions Precautions Precautions: Fall      Mobility Bed Mobility Overal bed mobility: Modified Independent                Transfers Overall transfer level: Needs assistance Equipment used: None Transfers: Sit to/from Stand Sit to Stand: Supervision         General transfer comment: supervision for safety and assist for lines    Balance                                            ADL Overall ADL's : Needs assistance/impaired Eating/Feeding: Independent;Sitting   Grooming: Wash/dry hands;Standing;Supervision/safety   Upper Body Bathing: Set up;Sitting   Lower Body Bathing: Supervison/ safety;Sit to/from stand   Upper Body Dressing : Set up;Sitting   Lower Body Dressing: Supervision/safety;Sit to/from stand   Toilet Transfer: Supervision/safety;Ambulation;BSC   Toileting- Clothing Manipulation and Hygiene: Minimal assistance;Sit to/from stand Toileting - Clothing Manipulation Details (indicate cue type and reason): educated pt in use of toileting tongs     Functional mobility during ADLs: Supervision/safety (for safety) General ADL Comments: Pt requiring more assistance than is  necessary for ADL per NT     Vision     Perception     Praxis      Pertinent Vitals/Pain Pain Assessment: No/denies pain     Hand Dominance Right   Extremity/Trunk Assessment Upper Extremity Assessment Upper Extremity Assessment: Overall WFL for tasks assessed   Lower Extremity Assessment Lower Extremity Assessment: Defer to PT evaluation   Cervical / Trunk Assessment Cervical / Trunk Assessment: Normal   Communication Communication Communication: Expressive difficulties (slurred speech, not sure if this is baseline)   Cognition Arousal/Alertness: Awake/alert Behavior During Therapy: WFL for tasks assessed/performed Overall Cognitive Status: Within Functional Limits for tasks assessed                     General Comments       Exercises       Shoulder Instructions      Home Living Family/patient expects to be discharged to:: Private residence Living Arrangements: Parent (plans to go home with parents in [place]) Available Help at Discharge: Family;Available 24 hours/day Type of Home: House Home Access: Level entry     Home Layout: One level     Bathroom Shower/Tub: Chief Strategy Officer: Standard     Home Equipment: Cane - single point   Additional Comments: pt uses cane when his gout flares      Prior Functioning/Environment Level of Independence: Independent        Comments:  pt reports he and his husband are separating    OT Diagnosis: Generalized weakness   OT Problem List: Decreased activity tolerance;Obesity   OT Treatment/Interventions: Self-care/ADL training;DME and/or AE instruction;Patient/family education    OT Goals(Current goals can be found in the care plan section) Acute Rehab OT Goals Patient Stated Goal: go home  OT Goal Formulation: With patient Time For Goal Achievement: 04/29/15 Potential to Achieve Goals: Good ADL Goals Pt Will Perform Grooming: with modified independence;standing Pt Will Transfer  to Toilet: with modified independence;ambulating;regular height toilet Pt Will Perform Tub/Shower Transfer: Tub transfer;with modified independence;ambulating Additional ADL Goal #1: Pt will be knowledgeable in availability of toilet tongs.  OT Frequency: Min 2X/week   Barriers to D/C:            Co-evaluation              End of Session    Activity Tolerance: Patient tolerated treatment well Patient left: in chair;with call bell/phone within reach   Time: 1100-1134 OT Time Calculation (min): 34 min Charges:  OT General Charges $OT Visit: 1 Procedure OT Evaluation $OT Eval Moderate Complexity: 1 Procedure OT Treatments $Self Care/Home Management : 8-22 mins G-Codes:    Evern Bio 04/22/2015, 11:54 AM  [phone]

## 2015-04-22 NOTE — Progress Notes (Signed)
Inpatient Diabetes Program Recommendations  AACE/ADA: New Consensus Statement on Inpatient Glycemic Control (2015)  Target Ranges:  Prepandial:   less than 140 mg/dL      Peak postprandial:   less than 180 mg/dL (1-2 hours)      Critically ill patients:  140 - 180 mg/dL   Results for Charles Cohen, Charles Cohen (MRN [id number]) as of 04/22/2015 09:27  Ref. Range 04/21/2015 08:18 04/21/2015 12:25 04/21/2015 16:16 04/21/2015 21:52  Glucose-Capillary Latest Ref Range: 65-99 mg/dL 409 (H) 811 (H) 914 (H) 138 (H)    Results for Charles Cohen, Charles Cohen (MRN [id number]) as of 04/22/2015 09:27  Ref. Range 04/18/2015 15:21  Hemoglobin A1C Latest Ref Range: 4.8-5.6 % 6.7 (H)    Admit with: Acute Renal Failure  History: HTN  Current Insulin Orders: Novolog Sensitive SSI (0-9 units) TID AC + HS      -Note per Dr. Vassie Moment notes from 01/25 patient with new diagnosis of DM as evidenced by A1c of 6.7%.  -RD consult has been ordered by Dr. Sharon Seller for DM diet education.  -Spoke with pt about new diagnosis.  Discussed A1C results with him and explained what an A1C is, basic pathophysiology of DM Type 2, basic home care, basic diabetes diet nutrition principles, importance of checking CBGs and maintaining good CBG control to prevent long-term and short-term complications.  Also reviewed blood sugar goals at home.  Encouraged patient to check his CBGs bid at home (fasting and rotate 2nd check of the day) and to keep a log book of all his CBG levels.  RNs have already been working with patient on checking CBGs with hospital lancets and meter.  -RNs to provide ongoing basic DM education at bedside with this patient.  Have ordered educational booklet and DM videos.  Patient does not want to watch DM videos yet.  Have encouraged patient to watch them before d/c.     MD- Not sure if patient will need medication at time of d/c.  With A1c of 6.7%, patient may be able to try modification of diet and exercise regimen at home before adding  oral DM medications.  Has required very little Novolog SSI since 01/24.    Patient stated he would rather try oral medication if medication is needed at all.  Typically patients are started on Metformin at time of diagnosis, however, patient was admitted with creatinine of 8.30 mg/dl.  Do Not recommend Sulfonylurea for discharge.  If decision made to try oral DM medication, could start with Metformin 500 mg bid (if creatinine remains stable) or could try DPP-4 inhibitor like Tradjenta 5 mg daily, however, patient may be able to manage blood sugars with diet and exercise alone at this time.      --Will follow patient during hospitalization--  Ambrose Finland RN, MSN, CDE Diabetes Coordinator Inpatient Glycemic Control Team Team Pager: [phone] (8a-5p)

## 2015-04-22 NOTE — Care Management Note (Signed)
Case Management Note  Patient Details  Name: Charles Cohen MRN: [id number] Date of Birth: Oct 21, 1975  Subjective/Objective:   Pt has been living with partner but states that he plans to return to [place] to live with his parents.  States he was living there previously and has [employer].  OT recommends no f/u, PT eval pending.                              Expected Discharge Plan:  Home/Self Care  IDischarge planning Services  CM Consult  Status of Service:  In process, will continue to follow  Magdalene River, RN 04/22/2015, 2:45 PM

## 2015-04-22 NOTE — Plan of Care (Signed)
Problem: Food- and Nutrition-Related Knowledge Deficit (NB-1.1) Goal: Nutrition education Formal process to instruct or train a patient/client in a skill or to impart knowledge to help patients/clients voluntarily manage or modify food choices and eating behavior to maintain or improve health. Outcome: Completed/Met Date Met:  04/22/15  RD consulted for nutrition education regarding diabetes.     Lab Results  Component Value Date    HGBA1C 6.7* 04/18/2015    RD provided "Carbohydrate Counting for People with Diabetes" handout from the Academy of Nutrition and Dietetics. Discussed different food groups and their effects on blood sugar, emphasizing carbohydrate-containing foods. Provided list of carbohydrates and recommended serving sizes of common foods.  Discussed importance of controlled and consistent carbohydrate intake throughout the day. Provided examples of ways to balance meals/snacks and encouraged intake of high-fiber, whole grain complex carbohydrates. Teach back method used.  Expect fair compliance.  Body mass index is 43.81 kg/(m^2). Pt meets criteria for Obesity Class III based on current BMI.  Current diet order is Heart Healthy/Carbohydrate Modified, patient is consuming approximately 100% of meals at this time. Labs and medications reviewed. No further nutrition interventions warranted at this time. If additional nutrition issues arise, please re-consult RD.  Arthur Holms, RD, LDN Pager #: [phone] After-Hours Pager #: [phone]

## 2015-04-22 NOTE — Progress Notes (Signed)
Patient oriented to room.  Pt denied any needs at this time.  Will continue to monitor.

## 2015-04-22 NOTE — Progress Notes (Signed)
[place] TEAM 1 - Stepdown/ICU TEAM Progress Note  Charles Cohen ZOX:[id number] DOB: Jul 12, 1975 DOA: 04/18/2015 PCP: No primary care provider on file.  Admit HPI / Brief Narrative: 40 year old male WM PMHx Chronic Back Pain, Gout  Presented to the ED at [facility] on 12/19 for hematuria. Work-up included CT renal stone study which was negative. He was felt to have UTI & sent home w/ abx and noted resolution of symptoms. On 1/16 he returned to the ER w/ bilateral LE swelling & pain, some shortness of breath and some nausea. His creatinine was 1.34. He was treated w/ IVFs, had CT abd w/ contrast (to eval nausea and bilirubinemia?), and ultimately was diagnosed w/ a gout flare and sent home w/ indomethacin and prednisone. His nausea continued to worsen over the following week. He had not been able to eat or drink much. He returned to to the ED 1/21 after collapsing w/ constant pressure in the chest and upper abdomen. On initial presentation was found to have anteriolateral changes on EKG and was to go directly to cath lab but then lab work came back w/ Cr of 8.3, BUN 134 and Na of 112.   HPI/Subjective: 1/26  A/O 4, NAD. Negative N/V, negative CP, negative SOB, positive watery stools  Assessment/Plan:  Acute Kidney failure in setting of Dehydration, IV dye load, NSAIDs, Diuretics and Ace-I (baseline cr 0.05 Mar 2015) -On admission Cr= 7.46--> 2.14 -->0.81 -follow trend; no hydronephrosis on [place] - Strict in and out since admission + 6.7 L  -Continue normal saline 50 ml/hr  DKA v/s starvation ketosis - Severe dehydration  -(+) beta-hydroxybutyric acid and +AGMA - did not require insulin gtt - follow bicarb/lytes   Hyponatremia -Resolved    Possible inferior STEMI -Cards following - cardiac enzymes essentially normal  - TTE pending   Mild Encephalopathy  -Improving already - likely mostly due to uremia - follow -Uremia resolved  Hyperglycemia -  -1/22 hemoglobin A1c = 6.7 ; could also be  related to starvation ketosis. At worst meets criteria for control diabetic   Hypokalemia -Potassium goal> 4 -K-Dur 60 mEq   -Recheck at 1400  Hypophosphatemia -Sodium phosphorus 40 mEq -Recheck at 1400  H/o HTN  Not an active issue presently   Nausea in setting of uremia  -Resolved  Hemoconcentration -Baseline hgb is 12.9 - presented w/ hbg > 16 - Hgb declining w/ hydration as expected  -Back to baseline  Diarrhea watery -Obtain C. difficile by PCR -Obtain acute hepatitis panel -Obtain HIV  Morbid Obesity - Body mass index is 46.56 kg/(m^2).   Code Status: FULL Family Communication: no family present at time of exam Disposition Plan: Home with parents    Consultants: Dr.Murali Ramaswamy PCCM  Procedure/Significant Events:    Culture 1/26 C. difficile pending 1/26 acute hepatitis panel pending 1/26 HIV pending   Antibiotics: NA  DVT prophylaxis: Subcutaneous heparin   Devices NA   LINES / TUBES:  Left IJ/22>>    Continuous Infusions: . sodium chloride 50 mL/hr at 04/22/15 0218    Objective: VITAL SIGNS: Temp: 97.9 F (36.6 C) (01/26 0755) Temp Source: Oral (01/26 0755) BP: 128/73 mmHg (01/26 0755) Pulse Rate: 102 (01/26 0755) SPO2; FIO2:   Intake/Output Summary (Last 24 hours) at 04/22/15 1208 Last data filed at 04/22/15 0600  Gross per 24 hour  Intake   3240 ml  Output   1000 ml  Net   2240 ml     Exam: General: A/O 4, NAD,  No acute respiratory distress Eyes: Negative headache, ,negative scleral hemorrhage ENT: Negative Runny nose, negative gingival bleeding, Neck:  Negative scars, masses, torticollis, lymphadenopathy, JVD Lungs: Clear to auscultation bilaterally without wheezes or crackles Cardiovascular: Regular rate and rhythm without murmur gallop or rub normal S1 and S2 Abdomen:negative abdominal pain, nondistended, positive soft, bowel sounds, no rebound, no ascites, no appreciable mass Extremities: No  significant cyanosis, clubbing, or edema bilateral lower extremities Psychiatric:  Negative depression, negative anxiety, negative fatigue, negative mania  Neurologic:  Cranial nerves II through XII intact, tongue/uvula midline, all extremities muscle strength 5/5, sensation intact throughout, negative dysarthria, negative expressive aphasia, negative receptive aphasia.   Data Reviewed: Basic Metabolic Panel:  Recent Labs Lab 04/18/15 1521  04/19/15 0910 04/19/15 1400 04/20/15 0339 04/21/15 0533 04/22/15 0641  NA 120*  < > 120* 122* 127* 135 138  K 3.4*  < > 3.2* 3.1* 2.9* 3.0* 3.2*  CL 77*  < > 78* 79* 91* 97* 105  CO2 14*  < > 22 24 21* 22 22  GLUCOSE 168*  < > 184* 180* 180* 144* 125*  BUN 138*  < > 131* 132* 105* 44* 23*  CREATININE 6.62*  < > 4.34* 3.76* 2.14* 1.06 0.81  CALCIUM 7.9*  < > 7.5* 8.1* 8.0* 8.1* 8.7*  MG 3.0*  --  2.5*  --   --  2.7* 2.0  PHOS 10.5*  --  7.3*  --   --  1.4* <1.0*  < > = values in this interval not displayed. Liver Function Tests:  Recent Labs Lab 04/18/15 1157 04/20/15 0339 04/21/15 0533 04/22/15 0641  AST 35 ALT 35 31 35 41  ALKPHOS 111 98 85 81  BILITOT 1.1 0.9 0.8 0.7  PROT 7.5 6.4* 6.1* 5.8*  ALBUMIN 3.5 2.9* 2.8* 2.6*    Recent Labs Lab 04/18/15 1157  LIPASE 51   No results for input(s): AMMONIA in the last 168 hours. CBC:  Recent Labs Lab 04/18/15 1128 04/18/15 1136 04/18/15 1521 04/19/15 0910 04/20/15 0339 04/22/15 0641  WBC 25.7*  --  23.9* 16.2* 12.9* 12.3*  NEUTROABS 22.1*  --   --   --   --   --   HGB 16.9 19.4* 15.0 12.8* 12.5* 13.1  HCT 46.0 57.0* 41.2 36.7* 36.2* 38.7*  MCV 81.0  --  80.0 80.1 82.1 85.1  PLT 1017*  --  769* 634* 566* 326   Cardiac Enzymes:  Recent Labs Lab 04/18/15 1521 04/18/15 1733 04/19/15 0128  TROPONINI 0.04* <0.03 <0.03   BNP (last 3 results) No results for input(s): BNP in the last 8760 hours.  ProBNP (last 3 results) No results for input(s): PROBNP in the  last 8760 hours.  CBG:  Recent Labs Lab 04/21/15 0818 04/21/15 1225 04/21/15 1616 04/21/15 2152 04/22/15 0755  GLUCAP 137* 151* 139* 138* 112*    Recent Results (from the past 240 hour(s))  Urine culture     Status: None   Collection Time: 04/18/15 12:26 PM  Result Value Ref Range Status   Specimen Description URINE, CATHETERIZED  Final   Special Requests NONE  Final   Culture NO GROWTH 1 DAY  Final   Report Status 04/19/2015 FINAL  Final  MRSA PCR Screening     Status: None   Collection Time: 04/18/15  2:30 PM  Result Value Ref Range Status   MRSA by PCR NEGATIVE NEGATIVE Final    Comment:        The  GeneXpert MRSA Assay (FDA approved for NASAL specimens only), is one component of a comprehensive MRSA colonization surveillance program. It is not intended to diagnose MRSA infection nor to guide or monitor treatment for MRSA infections.   Culture, blood (routine x 2)     Status: None (Preliminary result)   Collection Time: 04/18/15  3:21 PM  Result Value Ref Range Status   Specimen Description BLOOD RIGHT ANTECUBITAL  Final   Special Requests BOTTLES DRAWN AEROBIC ONLY 6CC  Final   Culture NO GROWTH 3 DAYS  Final   Report Status PENDING  Incomplete  Culture, blood (routine x 2)     Status: None (Preliminary result)   Collection Time: 04/18/15  8:04 PM  Result Value Ref Range Status   Specimen Description BLOOD LEFT ARM  Final   Special Requests BOTTLES DRAWN AEROBIC ONLY 5CC  Final   Culture NO GROWTH 3 DAYS  Final   Report Status PENDING  Incomplete     Studies:  Recent x-ray studies have been reviewed in detail by the Attending Physician  Scheduled Meds:  Scheduled Meds: . antiseptic oral rinse  7 mL Mouth Rinse q12n4p  . aspirin EC  81 mg Oral Daily  . chlorhexidine  15 mL Mouth Rinse BID  . heparin  5,000 Units Subcutaneous 3 times per day  . insulin aspart  0-5 Units Subcutaneous QHS  . insulin aspart  0-9 Units Subcutaneous TID WC  . sodium  phosphate  Dextrose 5% IVPB  40 mEq Intravenous Once    Time spent on care of this patient: 40 mins   Charles Cohen, Roselind Messier , MD  [facility] Office  [phone] Pager - [phone]  On-Call/Text Page:      [email or web address]      password [email or web address]  If 7PM-7AM, please contact night-coverage [email or web address] Password [email or web address] 04/22/2015, 12:08 PM   LOS: 4 days   Care during the described time interval was provided by me .  I have reviewed this patient's available data, including medical history, events of note, physical examination, and all test results as part of my evaluation. I have personally reviewed and interpreted all radiology studies.   Carolyne Littles, MD [phone] Pager

## 2015-04-23 ENCOUNTER — Inpatient Hospital Stay (HOSPITAL_COMMUNITY): Payer: Medicaid Other

## 2015-04-23 DIAGNOSIS — R079 Chest pain, unspecified: Secondary | ICD-10-CM

## 2015-04-23 DIAGNOSIS — I213 ST elevation (STEMI) myocardial infarction of unspecified site: Secondary | ICD-10-CM

## 2015-04-23 LAB — MAGNESIUM: MAGNESIUM: 1.7 mg/dL (ref 1.7–2.4)

## 2015-04-23 LAB — COMPREHENSIVE METABOLIC PANEL
ALT: 44 U/L (ref 17–63)
AST: 43 U/L — ABNORMAL HIGH (ref 15–41)
Albumin: 2.5 g/dL — ABNORMAL LOW (ref 3.5–5.0)
Alkaline Phosphatase: 81 U/L (ref 38–126)
Anion gap: 8 (ref 5–15)
BUN: 16 mg/dL (ref 6–20)
CHLORIDE: 106 mmol/L (ref 101–111)
CO2: 23 mmol/L (ref 22–32)
Calcium: 8.3 mg/dL — ABNORMAL LOW (ref 8.9–10.3)
Creatinine, Ser: 0.93 mg/dL (ref 0.61–1.24)
Glucose, Bld: 138 mg/dL — ABNORMAL HIGH (ref 65–99)
POTASSIUM: 4.4 mmol/L (ref 3.5–5.1)
Sodium: 137 mmol/L (ref 135–145)
TOTAL PROTEIN: 5.8 g/dL — AB (ref 6.5–8.1)
Total Bilirubin: 1.9 mg/dL — ABNORMAL HIGH (ref 0.3–1.2)

## 2015-04-23 LAB — HEPATITIS PANEL, ACUTE
HEP A IGM: NEGATIVE
HEP B C IGM: NEGATIVE
HEP B S AG: NEGATIVE

## 2015-04-23 LAB — CULTURE, BLOOD (ROUTINE X 2)
Culture: NO GROWTH
Culture: NO GROWTH

## 2015-04-23 LAB — PHOSPHORUS: PHOSPHORUS: 1.6 mg/dL — AB (ref 2.5–4.6)

## 2015-04-23 LAB — GLUCOSE, CAPILLARY
GLUCOSE-CAPILLARY: 135 mg/dL — AB (ref 65–99)
Glucose-Capillary: 126 mg/dL — ABNORMAL HIGH (ref 65–99)
Glucose-Capillary: 130 mg/dL — ABNORMAL HIGH (ref 65–99)

## 2015-04-23 LAB — HIV ANTIBODY (ROUTINE TESTING W REFLEX): HIV SCREEN 4TH GENERATION: NONREACTIVE

## 2015-04-23 MED ORDER — ASPIRIN 81 MG PO TBEC: 81.0000 mg | DELAYED_RELEASE_TABLET | Freq: Every day | ORAL | Status: AC

## 2015-04-23 MED ORDER — PERFLUTREN LIPID MICROSPHERE
1.0000 mL | INTRAVENOUS | Status: AC | PRN
  Filled 2015-04-23: qty 10

## 2015-04-23 NOTE — Progress Notes (Signed)
Nsg Discharge Note  Admit Date:  04/18/2015 Discharge date: 04/23/2015   Pasty Spillers to be D/C'd Home per MD order.  AVS completed.  Copy for chart, and copy for patient signed, and dated. Patient/caregiver able to verbalize understanding.  Discharge Medication:   Medication List    STOP taking these medications        Black Cherry Concentrate Liqd     BLACK CHERRY CONCENTRATE PO     indomethacin 50 MG capsule  Commonly known as:  INDOCIN     predniSONE 10 MG (21) Tbpk tablet  Commonly known as:  STERAPRED UNI-PAK 21 TAB      TAKE these medications        aspirin 81 MG EC tablet  Take 1 tablet (81 mg total) by mouth daily.        Discharge Assessment: Filed Vitals:   04/23/15 0454 04/23/15 1358  BP: 158/68 139/84  Pulse: 113 116  Temp: 100.1 F (37.8 C) 98.1 F (36.7 C)  Resp: 18 18   Skin clean, dry and intact without evidence of skin break down, no evidence of skin tears noted. IV catheter discontinued intact. Site without signs and symptoms of complications - no redness or edema noted at insertion site, patient denies c/o pain - only slight tenderness at site.  Dressing with slight pressure applied.  D/c Instructions-Education: Discharge instructions given to patient/family with verbalized understanding. D/c education completed with patient/family including follow up instructions, medication list, d/c activities limitations if indicated, with other d/c instructions as indicated by MD - patient able to verbalize understanding, all questions fully answered. Patient instructed to return to ED, call 911, or call MD for any changes in condition.  Patient escorted via WC, and D/C home via private auto.  Kern Reap, RN 04/23/2015 6:43 PM

## 2015-04-23 NOTE — Progress Notes (Signed)
Occupational Therapy Treatment and Discharge Patient Details Name: Charles Cohen MRN: [id number] DOB: Jul 28, 1975 Today's Date: 04/23/2015    History of present illness Pt admitted with severe renal failure and shock.  PMH: back pain, gout, HLD, HTN, depression, obesity.   OT comments  Pt is performing ADL and ADL transfers modified independently to independently.  He is eager to go home.  Follow Up Recommendations  No OT follow up    Equipment Recommendations  None recommended by OT    Recommendations for Other Services      Precautions / Restrictions Precautions Precautions: None       Mobility Bed Mobility Overal bed mobility: Modified Independent                Transfers Overall transfer level: Independent Equipment used: None                  Balance                                   ADL Overall ADL's : Modified independent                                     Functional mobility during ADLs: Independent General ADL Comments: Pt demonstrating ability to perform standing grooming, toilet and tub transfers and pericare without AD.      Vision                     Perception     Praxis      Cognition   Behavior During Therapy: WFL for tasks assessed/performed Overall Cognitive Status: Within Functional Limits for tasks assessed                       Extremity/Trunk Assessment               Exercises     Shoulder Instructions       General Comments      Pertinent Vitals/ Pain       Pain Assessment: No/denies pain  Home Living                                          Prior Functioning/Environment              Frequency       Progress Toward Goals  OT Goals(current goals can now be found in the care plan section)  Progress towards OT goals: Goals met/education completed, patient discharged from OT  Acute Rehab OT Goals Patient Stated Goal: go home  today  Plan Discharge plan remains appropriate    Co-evaluation                 End of Session     Activity Tolerance Patient tolerated treatment well   Patient Left in bed;with call bell/phone within reach   Nurse Communication          Time: 1543-1600 OT Time Calculation (min): 17 min  Charges: OT General Charges $OT Visit: 1 Procedure OT Treatments $Self Care/Home Management : 8-22 mins  Malka So 04/23/2015, 4:28 PM  [phone]

## 2015-04-23 NOTE — Progress Notes (Signed)
UR COMPLETED  

## 2015-04-23 NOTE — Discharge Summary (Signed)
Physician Discharge Summary  Charles Cohen WGN:[id number] DOB: 11-Jun-1975 DOA: 04/18/2015  PCP: No primary care provider on file.  Admit date: 04/18/2015 Discharge date: 04/23/2015  Time spent: 35 minutes  Recommendations for Outpatient Follow-up:   Acute Kidney failure in setting of Dehydration, IV dye load, NSAIDs, Diuretics and Ace-I (baseline cr 0.05 Mar 2015) -On admission Cr= 7.46--> 2.14 -->0.81--> 0.93 -follow trend; no hydronephrosis on [place] - Strict in and out since admission + 6.5 L  -Patient will be returning to [place] to stay with his parents. Urged to establish care with a PCP in the area and monitor renal function.  DKA v/s starvation ketosis - Severe dehydration  -(+) beta-hydroxybutyric acid and +AGMA - did not require insulin gtt - follow bicarb/lytes   Hyponatremia -Resolved   Possible inferior STEMI -Cards following - cardiac enzymes essentially normal  - TTE; WNL   Mild Encephalopathy  -Improving already - likely mostly due to uremia - follow -Uremia resolved  Hyperglycemia -  -1/22 hemoglobin A1c = 6.7 ; could also be related to starvation ketosis. At worst meets criteria for control diabetic  -Patient will be returning to [place] to stay with his parents. Urged to establish care with a PCP in the area, and discuss P diabetes   Hypokalemia -Potassium goal> 4 -K-Dur 60 mEq  -Recheck at 1400  Hypophosphatemia -Sodium phosphorus 40 mEq -Recheck at 1400  H/o HTN  Not an active issue presently   Nausea in setting of uremia  -Resolved  Hemoconcentration -Baseline hgb is 12.9 - presented w/ hbg > 16 - Hgb declining w/ hydration as expected  -Back to baseline  Diarrhea watery -Obtain C. difficile by PCR -Obtain acute hepatitis panel -Obtain HIV   Discharge Diagnoses:  Active Problems:   Shock (HCC)   Hypertension   Hyperlipidemia   Acute renal failure (ARF) (HCC)   Acute renal failure (HCC)   Back pain   Hyponatremia    Hypokalemia   Hyperphosphatemia   Uremia   Hyperglycemia   Diarrhea   Encephalopathy   Pain in the chest   Discharge Condition: Stable  Diet recommendation: Heart healthy  Filed Weights   04/20/15 0331 04/22/15 0529 04/23/15 0454  Weight: 140.66 kg (310 lb 1.6 oz) 138.5 kg (305 lb 5.4 oz) 144.017 kg (317 lb 8 oz)    History of present illness:  40 year old male WM PMHx Chronic Back Pain, Gout  Presented to the ED at [facility] on 12/19 for hematuria. Work-up included CT renal stone study which was negative. He was felt to have UTI & sent home w/ abx and noted resolution of symptoms. On 1/16 he returned to the ER w/ bilateral LE swelling & pain, some shortness of breath and some nausea. His creatinine was 1.34. He was treated w/ IVFs, had CT abd w/ contrast (to eval nausea and bilirubinemia?), and ultimately was diagnosed w/ a gout flare and sent home w/ indomethacin and prednisone. His nausea continued to worsen over the following week. He had not been able to eat or drink much. He returned to to the ED 1/21 after collapsing w/ constant pressure in the chest and upper abdomen. On initial presentation was found to have anteriolateral changes on EKG and was to go directly to cath lab but then lab work came back w/ Cr of 8.3, BUN 134 and Na of 112. Patient's chest pain resolved, renal function returned normal with hydration, echocardiogram showed no structural damage.   Consultants: Dr.Murali Ramaswamy PCCM  Procedure/Significant Events:  1/27 echocardiogram;- LVEF =55% to 60%. Wall motion was normal; there were no regional wall motion abnormalities. Left ventricular diastolic function parameters were normal.    Culture 1/22 urine negative final 1/22 blood right AC/left arm negative final 1/26 C. difficile negative 1/26 acute hepatitis panel negative 1/26 HIV negative      Discharge Exam: Filed Vitals:   04/22/15 1613 04/22/15 2140 04/23/15 0454 04/23/15 1358  BP: 140/76  137/63 158/68 139/84  Pulse: 112 113 113 116  Temp: 98.4 F (36.9 C) 99.5 F (37.5 C) 100.1 F (37.8 C) 98.1 F (36.7 C)  TempSrc: Oral Oral Oral Oral  Resp:  Height:      Weight:   144.017 kg (317 lb 8 oz)   SpO2: 100% 99% 100% 98%    General: A/O 4, NAD, No acute respiratory distress Eyes: Negative headache, ,negative scleral hemorrhage ENT: Negative Runny nose, negative gingival bleeding, Neck: Negative scars, masses, torticollis, lymphadenopathy, JVD Lungs: Clear to auscultation bilaterally without wheezes or crackles Cardiovascular: Regular rate and rhythm without murmur gallop or rub normal S1 and S2    Discharge Instructions     Medication List    STOP taking these medications        Black Cherry Concentrate Liqd     BLACK CHERRY CONCENTRATE PO     indomethacin 50 MG capsule  Commonly known as:  INDOCIN     predniSONE 10 MG (21) Tbpk tablet  Commonly known as:  STERAPRED UNI-PAK 21 TAB      TAKE these medications        aspirin 81 MG EC tablet  Take 1 tablet (81 mg total) by mouth daily.       Allergies  Allergen Reactions  . Shrimp [Shellfish Allergy]     Headaches  . Sulfa Antibiotics Hives      The results of significant diagnostics from this hospitalization (including imaging, microbiology, ancillary and laboratory) are listed below for reference.    Significant Diagnostic Studies: US Renal Port  04/18/2015  CLINICAL DATA:  Back pain. Urinary tract infection. Acute renal failure. EXAM: RENAL / URINARY TRACT ULTRASOUND COMPLETE COMPARISON:  None. FINDINGS: Right Kidney: Length: 11.4 cm. Normal renal cortical thickness and echogenicity without focal lesions or hydronephrosis. Left Kidney: Length: 12.5 cm. Normal renal cortical thickness and echogenicity without focal lesions or hydronephrosis. Simple 17 mm upper pole cyst. Bladder: Decompressed by Foley catheter. IMPRESSION: Unremarkable renal ultrasound examination. No renal lesions,  renal calculi or hydronephrosis. Electronically Signed   By: Rudie Meyer M.D.   On: 04/18/2015 17:42   Dg Chest Port 1 View  04/18/2015  CLINICAL DATA:  Central line placement, shortness of breath today EXAM: PORTABLE CHEST 1 VIEW COMPARISON:  04/18/2015 FINDINGS: Mild left lower lobe discoid atelectasis. Heart size and vascular pattern normal. No pneumothorax. It appears that there has been a left internal jugular central line placed. Rather than extending centrally into the superior vena cava, the line extends peripherally likely within the subclavian vein into the left axillary vein. IMPRESSION: In appropriate position of left central line as described above. Recommend withdrawing the line and replacing followed by repeat chest radiograph. Critical Value/emergent results were called by telephone at the time of interpretation on 04/18/2015 at 5:27 pm to [facility], the patient's nurse , who verbally acknowledged these results. Electronically Signed   By: Esperanza Heir M.D.   On: 04/18/2015 17:29   Dg Chest Port 1 View  04/18/2015  CLINICAL DATA:  40 year old  male code STEMI. Shortness of breath and diaphoresis. Initial encounter. EXAM: PORTABLE CHEST 1 VIEW COMPARISON:  None. FINDINGS: Portable AP supine view at 1153 hours. Mildly low lung volumes. Mild curvilinear atelectasis at the left lung base. Otherwise when allowing for portable technique the lungs are clear. No pneumothorax. Visualized tracheal air column is within normal limits. No osseous abnormality identified. Normal cardiac size and mediastinal contours. IMPRESSION: Mild left lung base atelectasis. No other acute cardiopulmonary abnormality. Electronically Signed   By: Odessa Fleming M.D.   On: 04/18/2015 12:48    Microbiology: Recent Results (from the past 240 hour(s))  Urine culture     Status: None   Collection Time: 04/18/15 12:26 PM  Result Value Ref Range Status   Specimen Description URINE, CATHETERIZED  Final   Special Requests NONE   Final   Culture NO GROWTH 1 DAY  Final   Report Status 04/19/2015 FINAL  Final  MRSA PCR Screening     Status: None   Collection Time: 04/18/15  2:30 PM  Result Value Ref Range Status   MRSA by PCR NEGATIVE NEGATIVE Final    Comment:        The GeneXpert MRSA Assay (FDA approved for NASAL specimens only), is one component of a comprehensive MRSA colonization surveillance program. It is not intended to diagnose MRSA infection nor to guide or monitor treatment for MRSA infections.   Culture, blood (routine x 2)     Status: None   Collection Time: 04/18/15  3:21 PM  Result Value Ref Range Status   Specimen Description BLOOD RIGHT ANTECUBITAL  Final   Special Requests BOTTLES DRAWN AEROBIC ONLY 6CC  Final   Culture NO GROWTH 5 DAYS  Final   Report Status 04/23/2015 FINAL  Final  Culture, blood (routine x 2)     Status: None   Collection Time: 04/18/15  8:04 PM  Result Value Ref Range Status   Specimen Description BLOOD LEFT ARM  Final   Special Requests BOTTLES DRAWN AEROBIC ONLY 5CC  Final   Culture NO GROWTH 5 DAYS  Final   Report Status 04/23/2015 FINAL  Final  C difficile quick scan w PCR reflex     Status: None   Collection Time: 04/22/15  1:45 PM  Result Value Ref Range Status   C Diff antigen NEGATIVE NEGATIVE Final   C Diff toxin NEGATIVE NEGATIVE Final   C Diff interpretation Negative for toxigenic C. difficile  Final     Labs: Basic Metabolic Panel:  Recent Labs Lab 04/19/15 0910  04/20/15 0339 04/21/15 0533 04/22/15 0641 04/22/15 1440 04/23/15 0430  NA 120*  < > 127* 135 138 136 137  K 3.2*  < > 2.9* 3.0* 3.2* 3.4* 4.4  CL 78*  < > 91* 97* 105 105 106  CO2 22  < > 21* 22 22 21* 23  GLUCOSE 184*  < > 180* 144* 125* 153* 138*  BUN 131*  < > 105* 44* 23* 19 16  CREATININE 4.34*  < > 2.14* 1.06 0.81 0.92 0.93  CALCIUM 7.5*  < > 8.0* 8.1* 8.7* 8.9 8.3*  MG 2.5*  --   --  2.7* 2.0 1.8 1.7  PHOS 7.3*  --   --  1.4* <1.0* 1.4* 1.6*  < > = values in this  interval not displayed. Liver Function Tests:  Recent Labs Lab 04/18/15 1157 04/20/15 0339 04/21/15 0533 04/22/15 0641 04/23/15 0430  AST 35 43*  ALT 35 31  35 41 44  ALKPHOS 111 98 85 81 81  BILITOT 1.1 0.9 0.8 0.7 1.9*  PROT 7.5 6.4* 6.1* 5.8* 5.8*  ALBUMIN 3.5 2.9* 2.8* 2.6* 2.5*    Recent Labs Lab 04/18/15 1157  LIPASE 51   No results for input(s): AMMONIA in the last 168 hours. CBC:  Recent Labs Lab 04/18/15 1128 04/18/15 1136 04/18/15 1521 04/19/15 0910 04/20/15 0339 04/22/15 0641  WBC 25.7*  --  23.9* 16.2* 12.9* 12.3*  NEUTROABS 22.1*  --   --   --   --   --   HGB 16.9 19.4* 15.0 12.8* 12.5* 13.1  HCT 46.0 57.0* 41.2 36.7* 36.2* 38.7*  MCV 81.0  --  80.0 80.1 82.1 85.1  PLT 1017*  --  769* 634* 566* 326   Cardiac Enzymes:  Recent Labs Lab 04/18/15 1521 04/18/15 1733 04/19/15 0128  TROPONINI 0.04* <0.03 <0.03   BNP: BNP (last 3 results) No results for input(s): BNP in the last 8760 hours.  ProBNP (last 3 results) No results for input(s): PROBNP in the last 8760 hours.  CBG:  Recent Labs Lab 04/22/15 1714 04/22/15 2138 04/23/15 0751 04/23/15 1159 04/23/15 1657  GLUCAP 114* 113* 126* 135* 130*       Signed:  Carolyne Littles, MD [facility] [phone] pager

## 2015-04-23 NOTE — Progress Notes (Signed)
  Echocardiogram 2D Echocardiogram with 4 mL of Definity has been performed.  Leta Jungling M 04/23/2015, 3:45 PM

## 2015-04-23 NOTE — Evaluation (Signed)
Physical Therapy Evaluation/Discharge Summary Patient Details Name: Charles Cohen MRN: [id number] DOB: Apr 10, 1975 Today's Date: 04/23/2015   History of Present Illness  Pt admitted with severe renal failure and shock.  PMH: back pain, gout, HLD, HTN, depression, obesity.  Clinical Impression  Patient evaluated by Physical Therapy with no further acute PT needs identified. Patient is currently independent with bed mobility, transfers and ambulation. All education has been completed and the patient has no further questions. Following the PT session the patient again denied having any questions or concerns and he stated that he felt confident with leaving today if possible. PT is signing off. Thank you for this referral.     Follow Up Recommendations No PT follow up    Equipment Recommendations  None recommended by PT    Recommendations for Other Services       Precautions / Restrictions Precautions Precautions: None Restrictions Weight Bearing Restrictions: No      Mobility  Bed Mobility Overal bed mobility: Modified Independent             General bed mobility comments: HOB elevated 30 degrees  Transfers Overall transfer level: Independent Equipment used: None Transfers: Sit to/from Stand Sit to Stand: Independent         General transfer comment: no instability   Ambulation/Gait Ambulation/Gait assistance: Independent Ambulation Distance (Feet): 250 Feet Assistive device: None Gait Pattern/deviations: Step-through pattern Gait velocity: WFL   General Gait Details: no loss of balance or instability noted.   Stairs Stairs:  (declined attempting stairs, feels confident)          Wheelchair Mobility    Modified Rankin (Stroke Patients Only)       Balance Overall balance assessment: Independent                                           Pertinent Vitals/Pain Pain Assessment: No/denies pain    Home Living Family/patient expects  to be discharged to:: Private residence Living Arrangements: Parent Available Help at Discharge: Family;Available 24 hours/day Type of Home: House Home Access: Stairs to enter Entrance Stairs-Rails: Right Entrance Stairs-Number of Steps: 2-3 Home Layout: One level Home Equipment: Gilmer Mor - single point Additional Comments: will stay with his parents in [place] upon D/C.     Prior Function Level of Independence: Independent               Hand Dominance        Extremity/Trunk Assessment   Upper Extremity Assessment: Defer to OT evaluation           Lower Extremity Assessment: Overall WFL for tasks assessed         Communication   Communication: No difficulties  Cognition Arousal/Alertness: Awake/alert Behavior During Therapy: WFL for tasks assessed/performed Overall Cognitive Status: Within Functional Limits for tasks assessed                      General Comments      Exercises        Assessment/Plan    PT Assessment Patent does not need any further PT services  PT Diagnosis     PT Problem List    PT Treatment Interventions     PT Goals (Current goals can be found in the Care Plan section) Acute Rehab PT Goals Patient Stated Goal: go home today PT Goal Formulation: With patient Time For Goal  Achievement: 04/23/15 Potential to Achieve Goals: Good    Frequency     Barriers to discharge        Co-evaluation               End of Session Equipment Utilized During Treatment: Gait belt Activity Tolerance: Patient tolerated treatment well Patient left: in bed;with call bell/phone within reach Nurse Communication: Mobility status         Time: 1914-7829 PT Time Calculation (min) (ACUTE ONLY): 12 min   Charges:   PT Evaluation $PT Eval Moderate Complexity: 1 Procedure     PT G Codes:        Christiane Ha, PT, CSCS Pager [phone] Office [phone]  04/23/2015, 11:51 AM

## 2015-04-30 ENCOUNTER — Encounter (HOSPITAL_COMMUNITY): Payer: Self-pay | Admitting: *Deleted

## 2015-05-05 ENCOUNTER — Ambulatory Visit: Payer: Self-pay | Admitting: Urology

## 2017-04-15 IMAGING — DX DG ANKLE COMPLETE 3+V*R*
3 series · 3 of 3 positions shown · non-contrast
Comparison: None.

CLINICAL DATA: Right and left ankle pain and swelling for 2 weeks,
no known injury

EXAM:
RIGHT ANKLE - COMPLETE 3+ VIEW

[ankle ap]
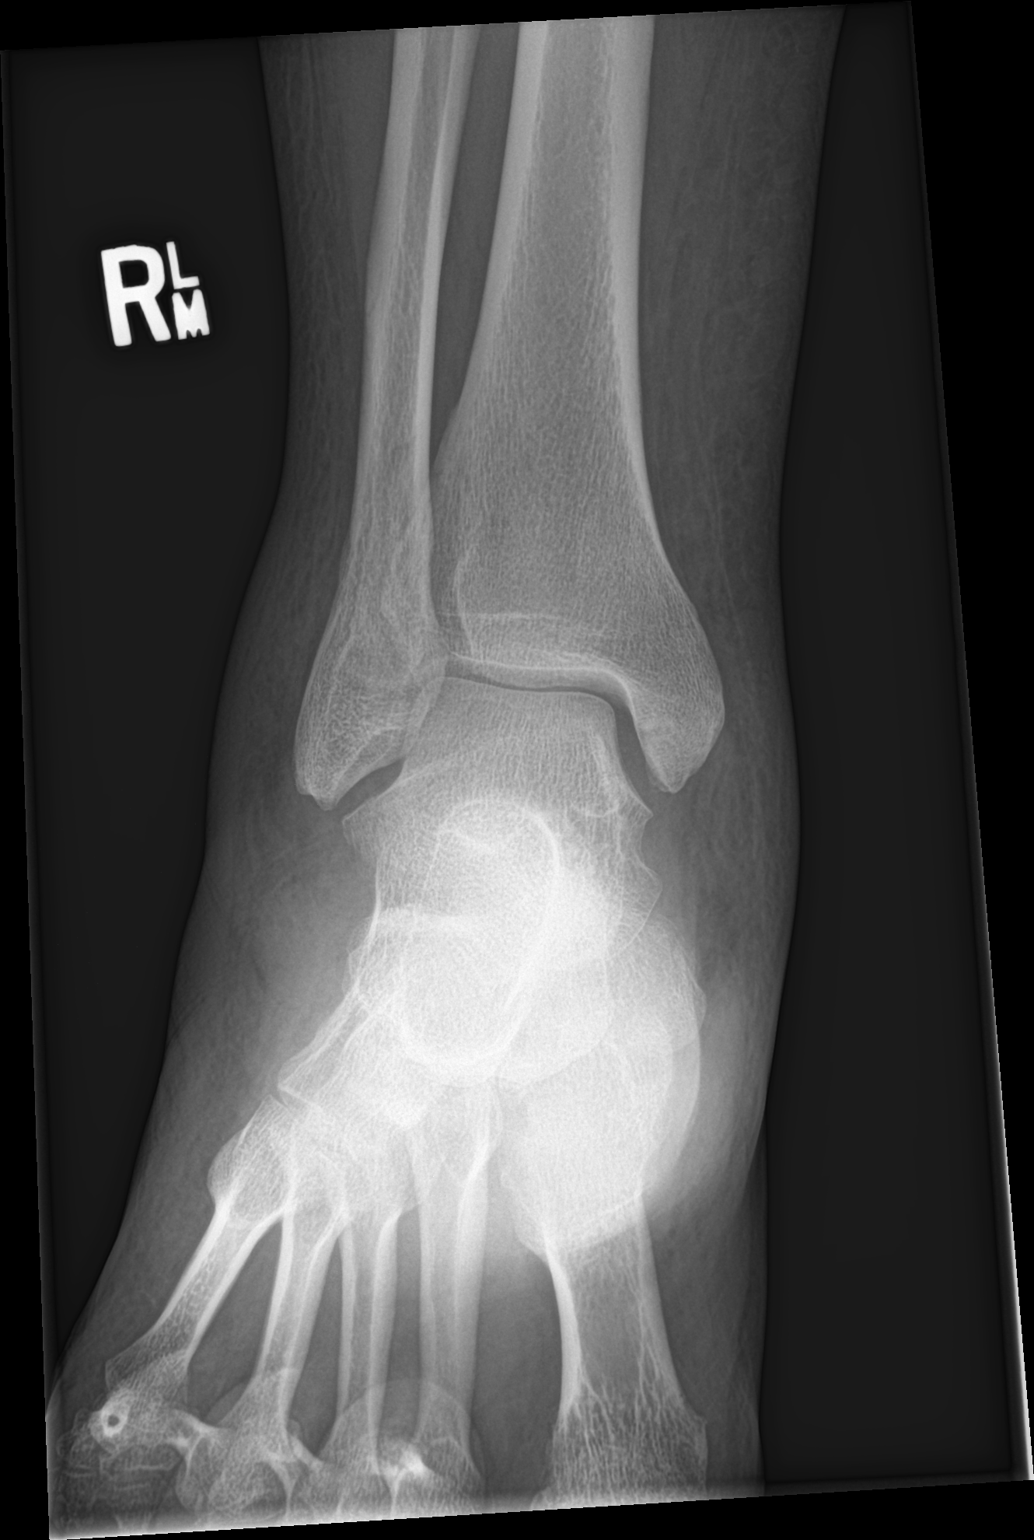

[ankle obl]
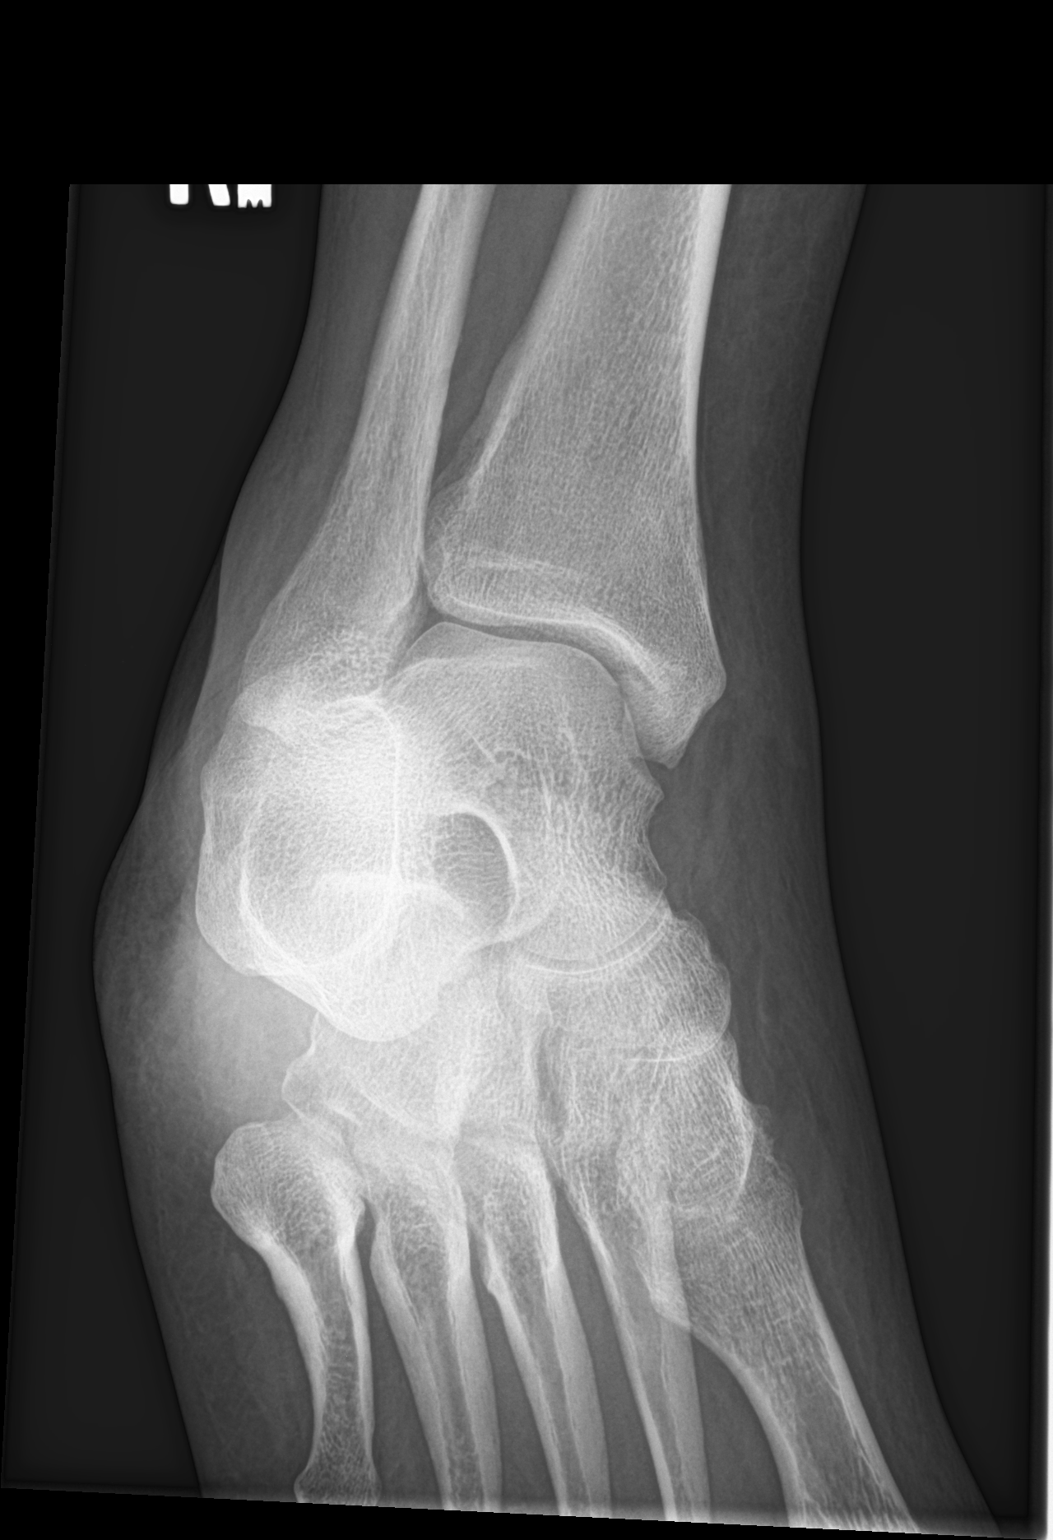

[ankle lat]
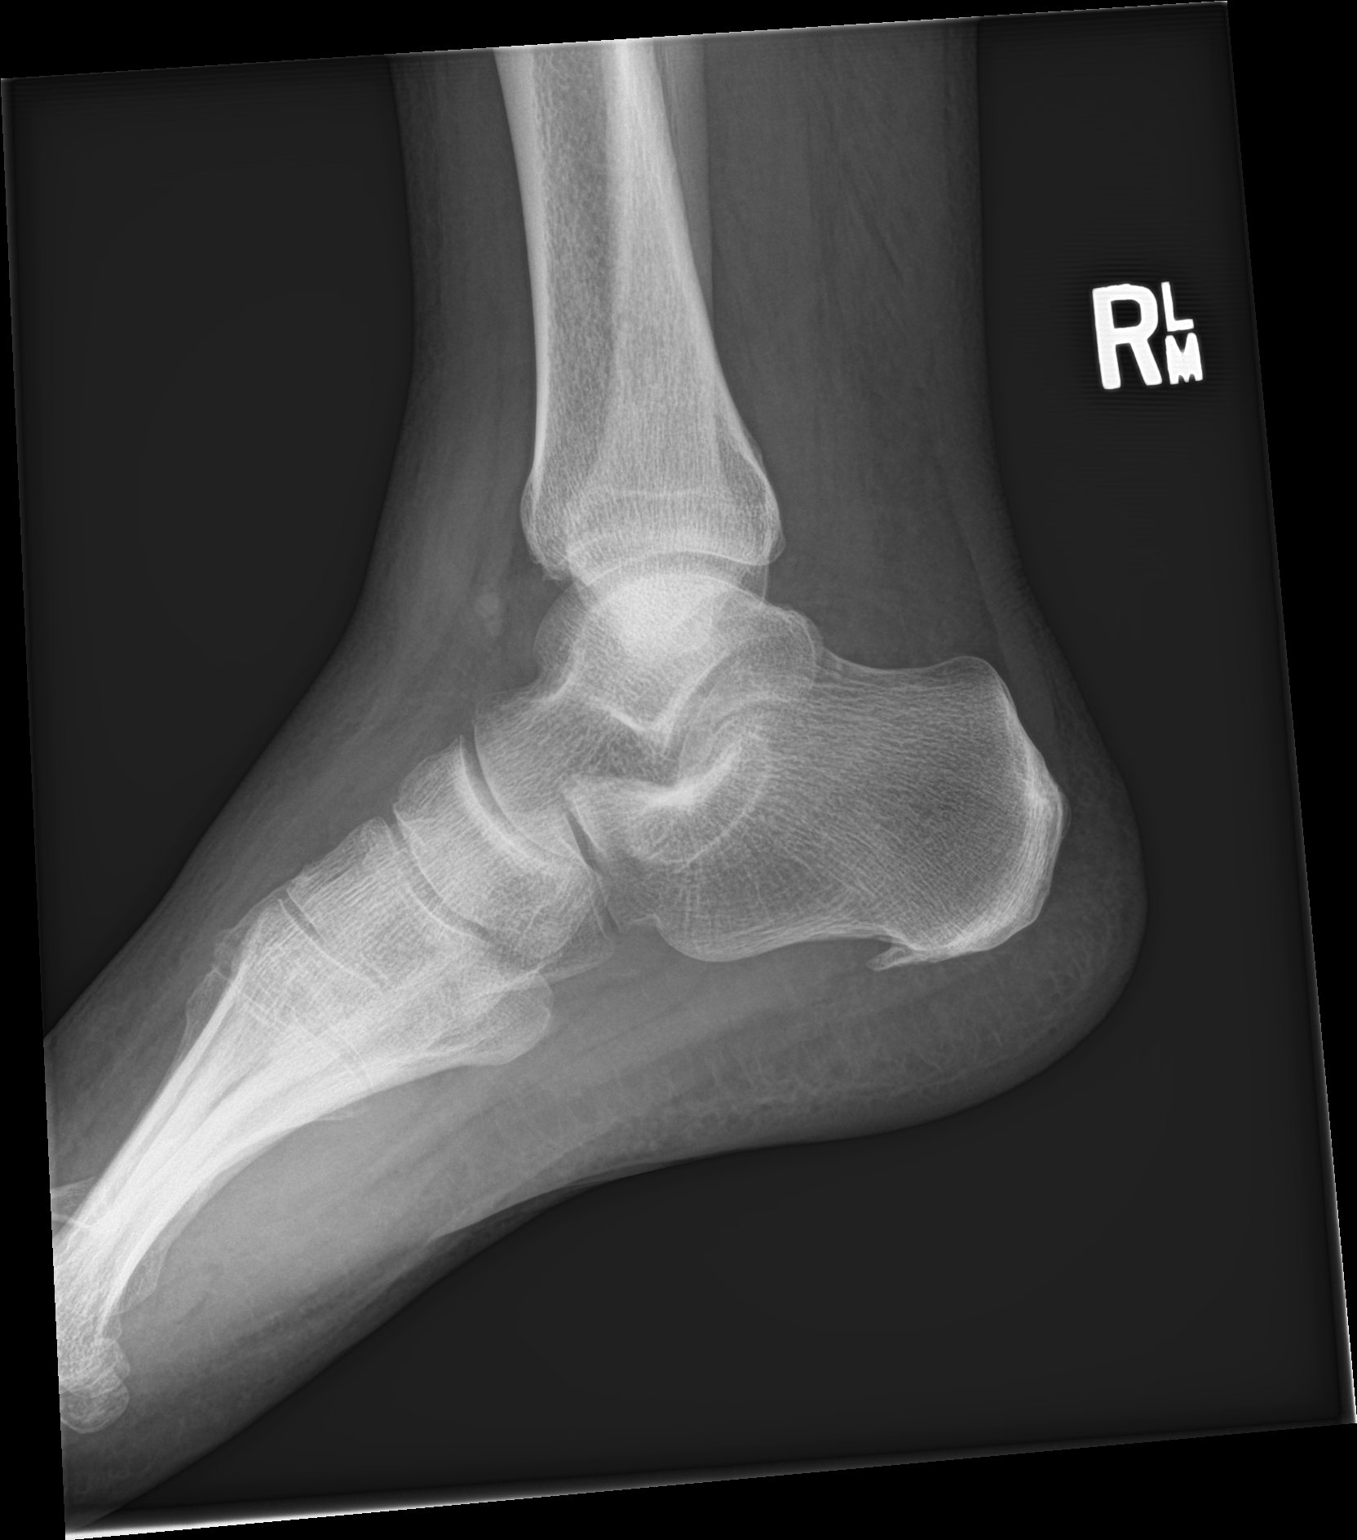

[3 of 3 positions shown; findings below may reference images not displayed]

FINDINGS: Three views of the right ankle submitted. No acute fracture or
subluxation. Ankle mortise is preserved. Diffuse soft tissue
swelling is noted. Small plantar spur of calcaneus.
IMPRESSION: No acute fracture or subluxation. Soft tissue swelling around the
ankle. Small plantar spur of calcaneus

## 2017-04-15 IMAGING — CT CT ABD-PELV W/ CM
2 of 8 series · 13 of 46 positions shown, 18 images · IV contrast (Omnipaque 300)
Comparison: March 15, 2015

CLINICAL DATA: Generalized abdominal pain and swelling for 2 weeks.
Redness and swelling bilateral feet for 2 weeks.

EXAM:
CT ABDOMEN AND PELVIS WITH CONTRAST
TECHNIQUE: Multidetector CT imaging of the abdomen and pelvis was performed
using the standard protocol following bolus administration of
intravenous contrast.
CONTRAST:  100mL OMNIPAQUE IOHEXOL 300 MG/ML  SOLN

[Series 2: abd_pel_with 5.0 b40s · axial · 0.98mm/px · z∈[+354,+838]mm · 10 of 113 slices shown, 15 images]
[im 8/113  soft-tissue]
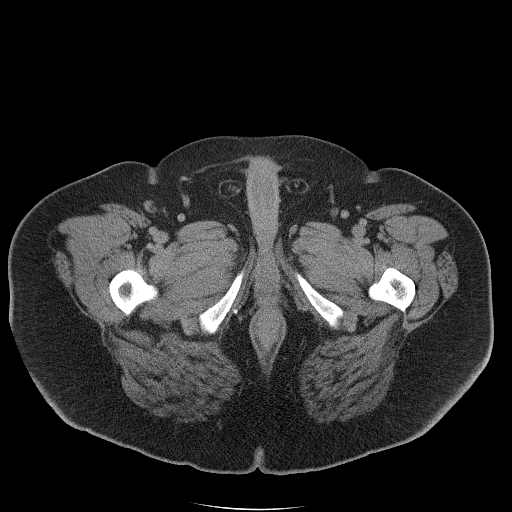
[im 8/113  bone]
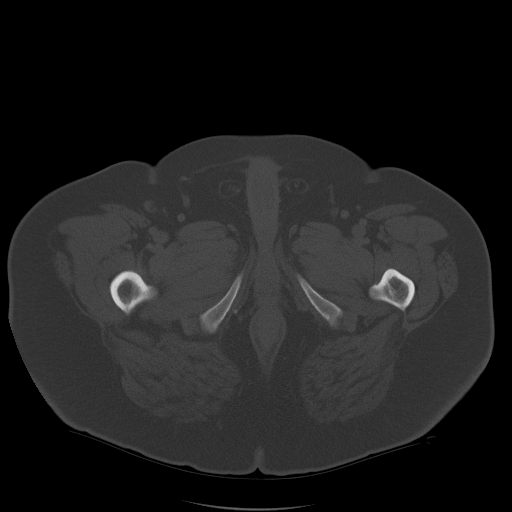
[im 23/113  soft-tissue]
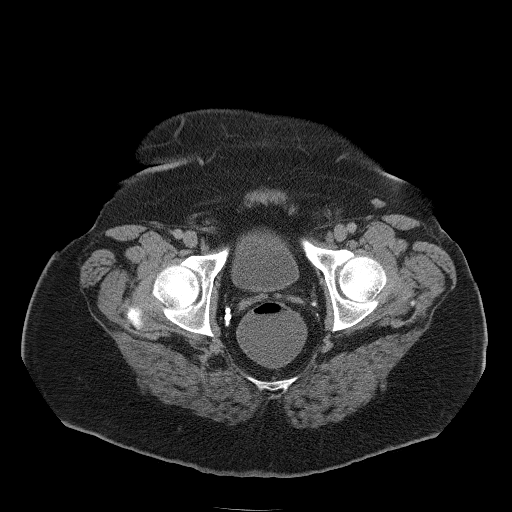
[im 30/113  soft-tissue]
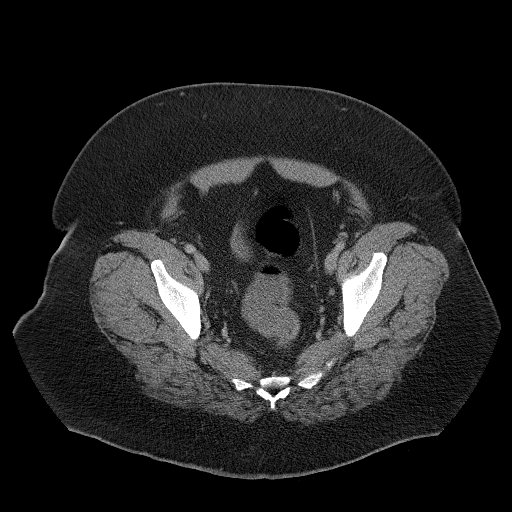
[im 45/113  soft-tissue]
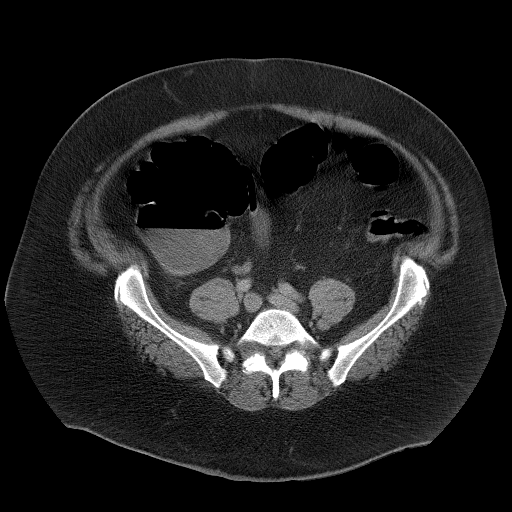
[im 60/113  soft-tissue]
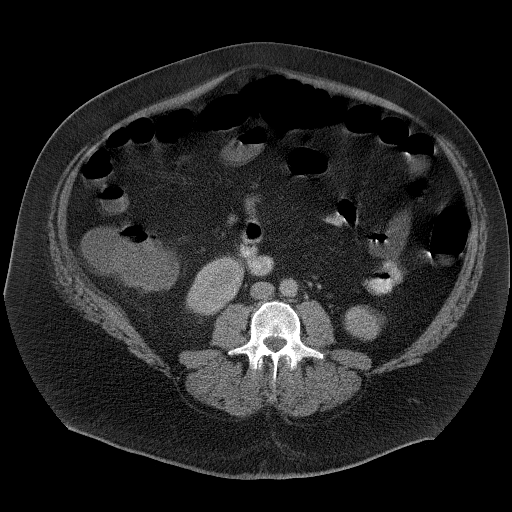
[im 68/113  soft-tissue]
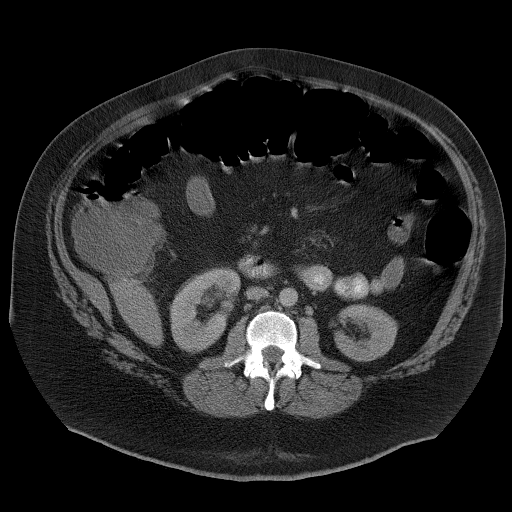
[im 83/113  soft-tissue]
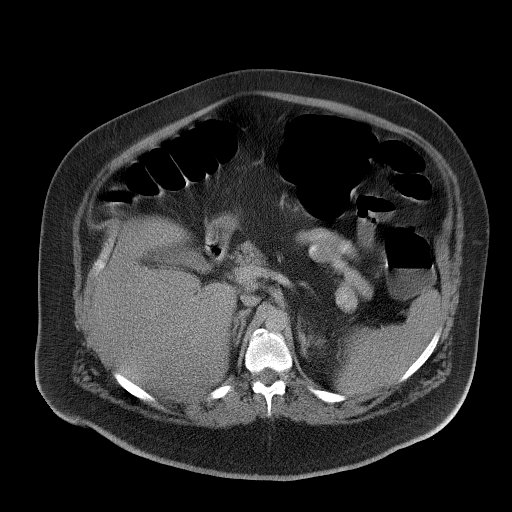
[im 83/113  lung]
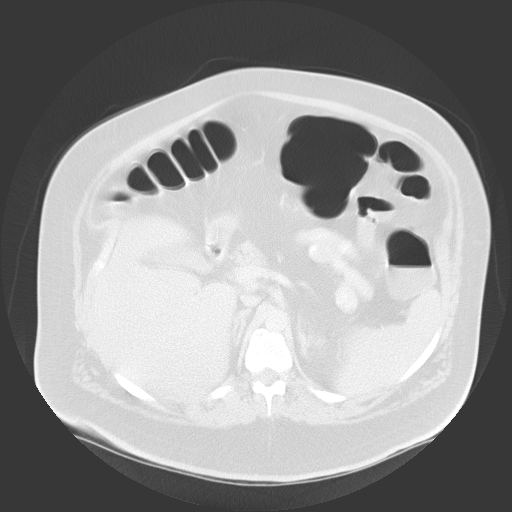
[im 90/113  soft-tissue]
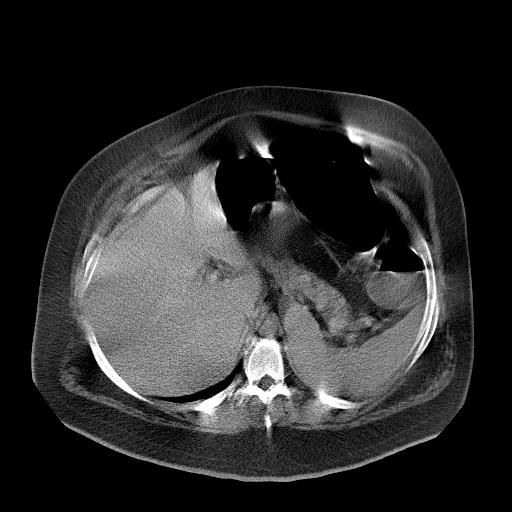
[im 90/113  lung]
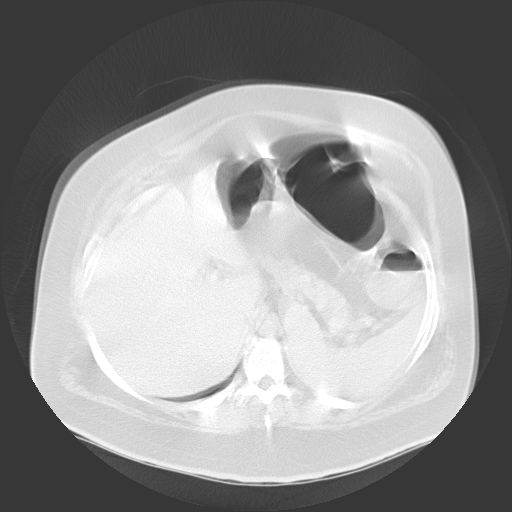
[im 98/113  lung]
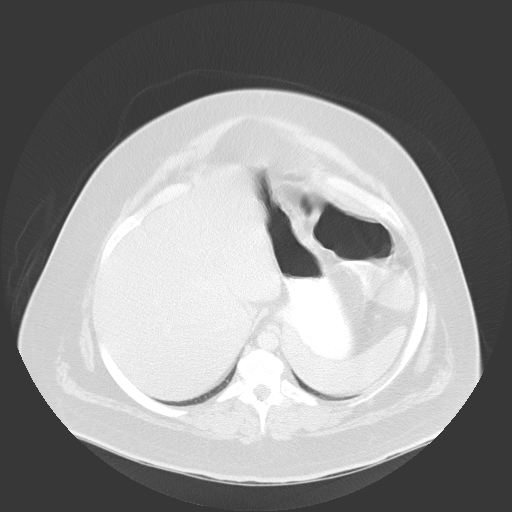
[im 105/113  soft-tissue]
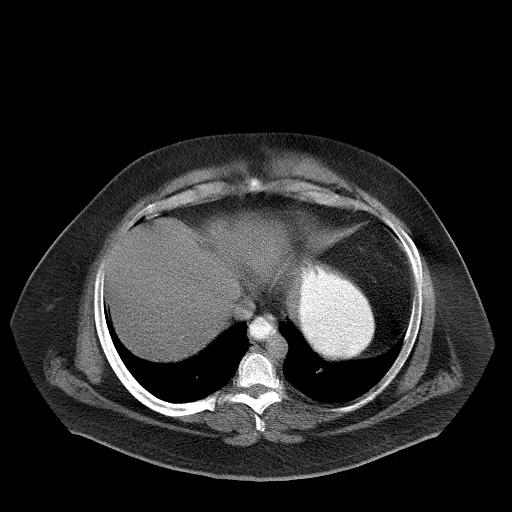
[im 105/113  lung]
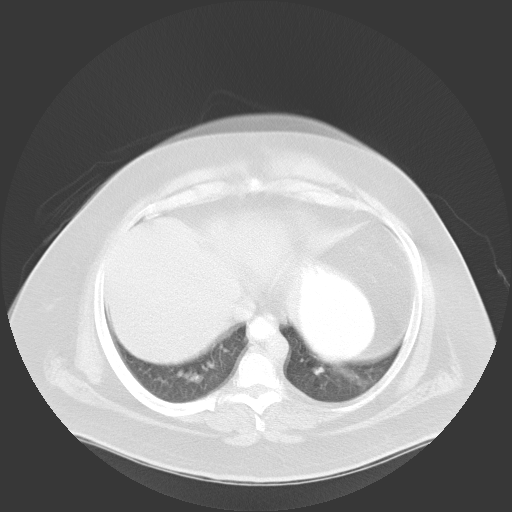
[im 105/113  bone]
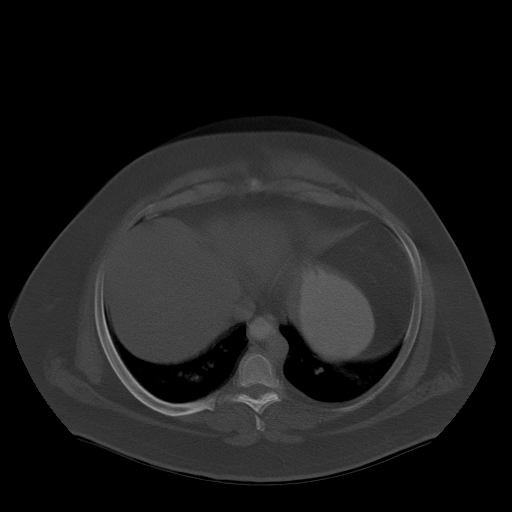

[Series 6: mpr cor post contrast 3.0mm · coronal · 0.98mm/px · 3 of 140 slices shown]
[im 35/140  soft-tissue]
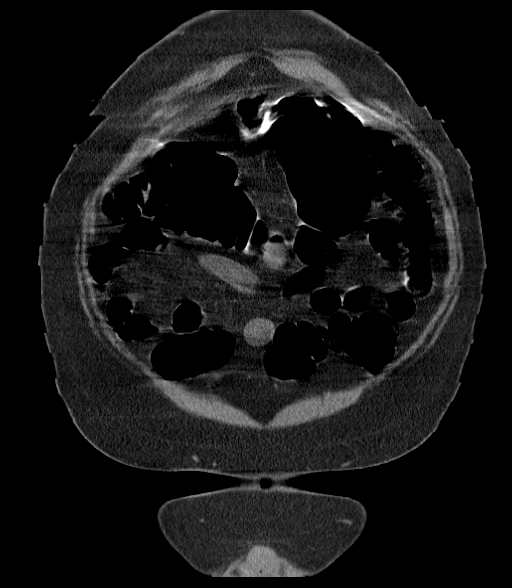
[im 70/140  soft-tissue]
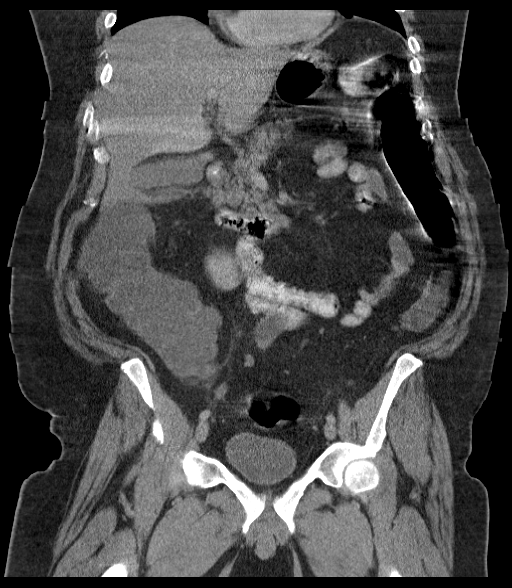
[im 105/140  soft-tissue]
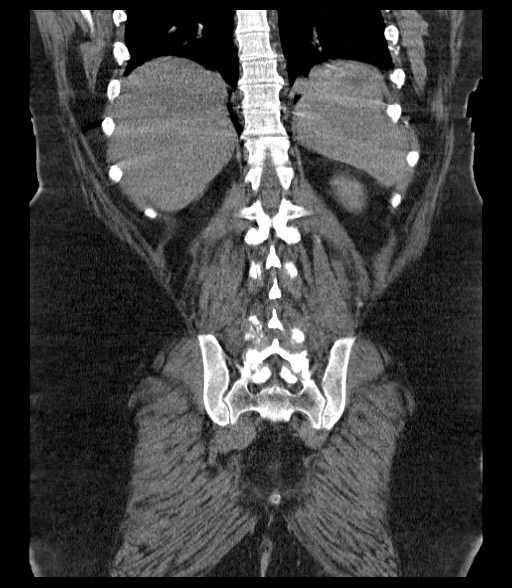

[13 of 46 positions shown; findings below may reference images not displayed]

FINDINGS: A nodule in the right lung base on series 5, image 5 is stable.
Lingular atelectasis is noted, subsegmental. Lung bases are
otherwise within normal limits. Specifically, the heart size normal.

No free air or free fluid. The stomach and small bowel are normal in
appearance and caliber. The colon contains fluid, suggesting a
history of diarrhea. The colon is thin walled however with no
pericolonic stranding to suggest acute colitis. An air-fluid level
is seen in the cecum, with another in the ascending colon. The
appendix is well seen with no appendicitis. There is poor contrast
enhancement on today's study, possibly due to the fact the patient
vomited during the study or due to timing of contrast bolus.
However, the lack of contrast limits evaluation of parenchymal
organs. No focal hepatic lesions are identified. The gallbladder is
normal in appearance. The spleen, pancreas, adrenal glands, and
right kidney are normal. There is a probable cyst off the upper pole
of the left kidney, too small to characterize. There is contrast in
the renal collecting systems on delayed imaging with no filling
defects. The abdominal aorta is normal in caliber. No adenopathy.
Limited views of portal vein are normal. No significant ventral wall
defects.

The pelvis demonstrates no adenopathy or mass. The bladder is poorly
distended but unremarkable. The prostate seminal vesicles are
normal.

Degenerative change are seen in the lower lumbar facets. Bones are
otherwise unremarkable.
IMPRESSION: 1. Colonic dilatation, likely due to ileus. No colonic wall
thickening or pericolonic stranding to suggest colitis. Fluid in the
colon consistent with diarrhea. There are few scattered colonic
diverticuli with no diverticulitis.
